# Patient Record
Sex: Female | Born: 1980 | Race: Black or African American | Hispanic: No | Marital: Single | Smoking: Never smoker
Health system: Southern US, Community
[De-identification: ages and names within clinical notes are randomized; demographics above are authoritative.]

## PROBLEM LIST (undated history)

## (undated) DIAGNOSIS — C539 Malignant neoplasm of cervix uteri, unspecified: Secondary | ICD-10-CM

## (undated) HISTORY — PX: CERVIX SURGERY: SHX593

---
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
## 2015-03-08 DIAGNOSIS — C539 Malignant neoplasm of cervix uteri, unspecified: Secondary | ICD-10-CM

## 2015-03-08 HISTORY — DX: Malignant neoplasm of cervix uteri, unspecified: C53.9

## 2018-03-07 NOTE — L&D Delivery Note (Addendum)
OB/GYN Faculty Practice Delivery Note  Maria Reed is a 38 y.o. G3P0020 s/p SVD at [redacted]w[redacted]d. She was admitted for admittedon 10/4/2020with PPROM at 23 weeks.Pt is now [redacted]w[redacted]d transferred to L&D for SOL and Triple I.  ROM: 807h 42m with clear fluid GBS Status: GBS unknown Maximum Maternal Temperature: 99 F  Labor Progress: . Patient presented to the hospital on 12/09/2018 with PPROM at 23 weeks. She was managed on antepartum until 01/11/2019 at which point she started to have painful contractions and to make cervical change. At that time she also developed uterine tenderness and was diagnosed with Triple I and transferred to L&D. Initial SVE on arrival was 2.5 cm. She progressed spontaneously to fully dilated.   Delivery Date/Time: 01/12/2019 at 0045  Delivery: Called to room and patient was complete and pushing. Head delivered ROA. No nuchal cord present. Shoulder and body delivered in usual fashion. Infant initially with spontaneous cry but then became apneic, so cord was clamped and cut immediately. Upon initial clamping cord avulsed at point of clamping and had to be re-clamped, at which point infant handed off to neonatal team. Infant required PPV and subsequently intubation. Cord blood drawn. Placenta delivered spontaneously with gentle cord traction. Fundus firm with massage and Pitocin. Labia, perineum, vagina, and cervix inspected inspected with no lacerations.  Baby Weight: 880 g   Placenta: Sent to pathology  Complications: none Lacerations: none  EBL: 100 ml Analgesia: Epidural   Infant: APGAR (1 MIN): 5   APGAR (5 MINS): 7   APGAR (10 MINS): [place], MD, PGY1  OBGYN Faculty Teaching Service  01/12/2019, 1:06 AM   OB FELLOW ATTESTATION  I was present, gloved, and supervising throughout delivery and have edited the above note to reflect changes and updates.   Augustin Coupe, MD/[facility] [place] Fellow  01/12/2019

## 2018-08-03 ENCOUNTER — Inpatient Hospital Stay (HOSPITAL_COMMUNITY): Payer: Medicaid Other

## 2018-08-03 ENCOUNTER — Encounter (HOSPITAL_COMMUNITY): Payer: Self-pay | Admitting: *Deleted

## 2018-08-03 ENCOUNTER — Inpatient Hospital Stay (HOSPITAL_COMMUNITY)
Admission: AD | Admit: 2018-08-03 | Discharge: 2018-08-03 | Disposition: A | Discharge status: Home / Self Care | Payer: Medicaid Other | Attending: Obstetrics and Gynecology | Admitting: Obstetrics and Gynecology

## 2018-08-03 DIAGNOSIS — Z8541 Personal history of malignant neoplasm of cervix uteri: Secondary | ICD-10-CM | POA: Insufficient documentation

## 2018-08-03 DIAGNOSIS — O9989 Other specified diseases and conditions complicating pregnancy, childbirth and the puerperium: Secondary | ICD-10-CM | POA: Insufficient documentation

## 2018-08-03 DIAGNOSIS — O26891 Other specified pregnancy related conditions, first trimester: Secondary | ICD-10-CM | POA: Diagnosis not present

## 2018-08-03 DIAGNOSIS — O360110 Maternal care for anti-D [Rh] antibodies, first trimester, not applicable or unspecified: Secondary | ICD-10-CM

## 2018-08-03 DIAGNOSIS — O3680X0 Pregnancy with inconclusive fetal viability, not applicable or unspecified: Secondary | ICD-10-CM | POA: Diagnosis not present

## 2018-08-03 DIAGNOSIS — Z3A01 Less than 8 weeks gestation of pregnancy: Secondary | ICD-10-CM

## 2018-08-03 DIAGNOSIS — R109 Unspecified abdominal pain: Secondary | ICD-10-CM | POA: Diagnosis not present

## 2018-08-03 DIAGNOSIS — Z6791 Unspecified blood type, Rh negative: Secondary | ICD-10-CM | POA: Diagnosis not present

## 2018-08-03 DIAGNOSIS — O26899 Other specified pregnancy related conditions, unspecified trimester: Secondary | ICD-10-CM

## 2018-08-03 LAB — ABO/RH: ABO/RH(D): A NEG

## 2018-08-03 LAB — CBC
HCT: 31.4 % — ABNORMAL LOW (ref 36.0–46.0)
Hemoglobin: 11.1 g/dL — ABNORMAL LOW (ref 12.0–15.0)
MCH: 33.6 pg (ref 26.0–34.0)
MCHC: 35.4 g/dL (ref 30.0–36.0)
MCV: 95.2 fL (ref 80.0–100.0)
Platelets: 267 10*3/uL (ref 150–400)
RBC: 3.3 MIL/uL — ABNORMAL LOW (ref 3.87–5.11)
RDW: 11.8 % (ref 11.5–15.5)
WBC: 6.1 10*3/uL (ref 4.0–10.5)
nRBC: 0 % (ref 0.0–0.2)

## 2018-08-03 LAB — WET PREP, GENITAL
Sperm: NONE SEEN
Trich, Wet Prep: NONE SEEN
Yeast Wet Prep HPF POC: NONE SEEN

## 2018-08-03 LAB — URINALYSIS, ROUTINE W REFLEX MICROSCOPIC
Bilirubin Urine: NEGATIVE
Glucose, UA: NEGATIVE mg/dL
Ketones, ur: 5 mg/dL — AB
Nitrite: NEGATIVE
Protein, ur: NEGATIVE mg/dL
Specific Gravity, Urine: 1.003 — ABNORMAL LOW (ref 1.005–1.030)
pH: 7 (ref 5.0–8.0)

## 2018-08-03 LAB — HCG, QUANTITATIVE, PREGNANCY: hCG, Beta Chain, Quant, S: 2215 m[IU]/mL — ABNORMAL HIGH (ref <5)

## 2018-08-03 LAB — POCT PREGNANCY, URINE: Preg Test, Ur: POSITIVE — AB

## 2018-08-03 NOTE — MAU Note (Signed)
Maria Reed is a 38 y.o. at [redacted]w[redacted]d here in MAU reporting:laower abdominal cramping that started last Wednesday, denies pain at present. Denies any vaginal bleeding or abnormal discharge LMP: 07/01/18 Onset of complaint: Wednesday May 27th Pain score: 0 Vitals:   08/03/18 1059  BP: 107/78  Pulse: 90  Resp: 16  Temp: 98.2 F (36.8 C)     F[facility] Lab orders placed from triage: UA/UPT

## 2018-08-03 NOTE — Discharge Instructions (Signed)
[facility]     Phone: [phone]  [facility] [place]  Phone: [phone]  [facility] [place]  Phone: [place] [facility] [employer] [facility] [place]                           Phone: [place] [facility] [place] [facility]          Phone: [phone]  [facility]    Phone: [phone]   [facility])    Phone: [facility]    Phone: [phone]  [facility]    Phone: [place]    Phone: [phone]  [place] [facility]  Phone: [place]               Phone: [phone]  [facility]   Phone: [phone]  [facility]    Phone: [phone]                   Abdominal Pain, Adult  Many things can cause belly (abdominal) pain. Most times, belly pain is not dangerous. Many cases of belly pain can be watched and treated at home. Sometimes belly pain is serious, though. Your doctor will try to find the cause of your belly pain. Follow these instructions at home:  Take over-the-counter and prescription medicines only as told by your doctor. Do not take medicines that help you poop (laxatives) unless told to by your doctor.  Drink enough fluid to keep your pee (urine) clear or pale yellow.  Watch your belly pain for any changes.  Keep all follow-up visits as told by your doctor. This is important. Contact a doctor if:  Your belly pain changes or gets worse.  You are not hungry, or you lose weight without trying.  You are having trouble pooping (constipated) or have watery poop (diarrhea) for more than 2-3 days.  You have pain when you pee or poop.  Your belly pain wakes you up at  night.  Your pain gets worse with meals, after eating, or with certain foods.  You are throwing up and cannot keep anything down.  You have a fever. Get help right away if:  Your pain does not go away as soon as your doctor says it should.  You cannot stop throwing up.  Your pain is only in areas of your belly, such as the right side or the left lower part of the belly.  You have bloody or black poop, or poop that looks like tar.  You have very bad pain, cramping, or bloating in your belly.  You have signs of not having enough fluid or water in your body (dehydration), such as: ? Dark pee, very little pee, or no pee. ? Cracked lips. ? Dry mouth. ? Sunken eyes. ? Sleepiness. ? Weakness. This information is not intended to replace advice given to you by your health care provider. Make sure you discuss any questions you have with your health care provider. Document Released: 08/10/2007 Document Revised: 09/11/2015 Document Reviewed: 08/05/2015 Elsevier Interactive Patient Education  2019 [employer].  Ectopic Pregnancy  An ectopic pregnancy is when the fertilized egg attaches (implants) outside the uterus. Most ectopic pregnancies occur in one of the tubes where eggs travel  from the ovary to the uterus (fallopian tubes), but the implanting can occur in other locations. In rare cases, ectopic pregnancies occur on the ovary, intestine, pelvis, abdomen, or cervix. In an ectopic pregnancy, the fertilized egg does not have the ability to develop into a normal, healthy baby. A ruptured ectopic pregnancy is one in which tearing or bursting of a fallopian tube causes internal bleeding. Often, there is intense lower abdominal pain, and vaginal bleeding sometimes occurs. Having an ectopic pregnancy can be life-threatening. If this dangerous condition is not treated, it can lead to blood loss, shock, or even death. What are the causes? The most common cause of this condition is damage to one  of the fallopian tubes. A fallopian tube may be narrowed or blocked, and that keeps the fertilized egg from reaching the uterus. What increases the risk? This condition is more likely to develop in women of childbearing age who have different levels of risk. The levels of risk can be divided into three categories. High risk  You have gone through infertility treatment.  You have had an ectopic pregnancy before.  You have had surgery on the fallopian tubes, or another surgical procedure, such as an abortion.  You have had surgery to have the fallopian tubes tied (tubal ligation).  You have problems or diseases of the fallopian tubes.  You have been exposed to diethylstilbestrol (DES). This medicine was used until 1971, and it had effects on babies whose mothers took the medicine.  You become pregnant while using an IUD (intrauterine device) for birth control. Moderate risk  You have a history of infertility.  You have had an STI (sexually transmitted infection).  You have a history of pelvic inflammatory disease (PID).  You have scarring from endometriosis.  You have multiple sexual partners.  You smoke. Low risk  You have had pelvic surgery.  You use vaginal douches.  You became sexually active before age 28. What are the signs or symptoms? Common symptoms of this condition include normal pregnancy symptoms, such as missing a period, nausea, tiredness, abdominal pain, breast tenderness, and bleeding. However, ectopic pregnancy will have additional symptoms, such as:  Pain with intercourse.  Irregular vaginal bleeding or spotting.  Cramping or pain on one side or in the lower abdomen.  Fast heartbeat, low blood pressure, and sweating.  Passing out while having a bowel movement. Symptoms of a ruptured ectopic pregnancy and internal bleeding may include:  Sudden, severe pain in the abdomen and pelvis.  Dizziness, weakness, light-headedness, or fainting.  Pain in  the shoulder or neck area. How is this diagnosed? This condition is diagnosed by:  A pelvic exam to locate pain or a mass in the abdomen.  A pregnancy test. This blood test checks for the presence as well as the specific level of pregnancy hormone in the bloodstream.  Ultrasound. This is performed if a pregnancy test is positive. In this test, a probe is inserted into the vagina. The probe will detect a fetus, possibly in a location other than the uterus.  Taking a sample of uterus tissue (dilation and curettage, or D&C).  Surgery to perform a visual exam of the inside of the abdomen using a thin, lighted tube that has a tiny camera on the end (laparoscope).  Culdocentesis. This procedure involves inserting a needle at the top of the vagina, behind the uterus. If blood is present in this area, it may indicate that a fallopian tube is torn. How is this treated? This condition  is treated with medicine or surgery. Medicine  An injection of a medicine (methotrexate) may be given to cause the pregnancy tissue to be absorbed. This medicine may save your fallopian tube. It may be given if: ? The diagnosis is made early, with no signs of active bleeding. ? The fallopian tube has not ruptured. ? You are considered to be a good candidate for the medicine. Usually, pregnancy hormone blood levels are checked after methotrexate treatment. This is to be sure that the medicine is effective. It may take 4-6 weeks for the pregnancy to be absorbed. Most pregnancies will be absorbed by 3 weeks. Surgery  A laparoscope may be used to remove the pregnancy tissue.  If severe internal bleeding occurs, a larger cut (incision) may be made in the lower abdomen (laparotomy) to remove the fetus and placenta. This is done to stop the bleeding.  Part or all of the fallopian tube may be removed (salpingectomy) along with the fetus and placenta. The fallopian tube may also be repaired during the surgery.  In very  rare circumstances, removal of the uterus (hysterectomy) may be required.  After surgery, pregnancy hormone testing may be done to be sure that there is no pregnancy tissue left. Whether your treatment is medicine or surgery, you may receive a Rho (D) immune globulin shot to prevent problems with any future pregnancy. This shot may be given if:  You are Rh-negative and the baby's father is Rh-positive.  You are Rh-negative and you do not know the Rh type of the baby's father. Follow these instructions at home:  Rest and limit your activity after the procedure for as long as told by your health care provider.  Until your health care provider says that it is safe: ? Do not lift anything that is heavier than 10 lb (4.5 kg), or the limit that your health care provider tells you. ? Avoid physical exercise and any movement that requires effort (is strenuous).  To help prevent constipation: ? Eat a healthy diet that includes fruits, vegetables, and whole grains. ? Drink 6-8 glasses of water per day. Get help right away if:  You develop worsening pain that is not relieved by medicine.  You have: ? A fever or chills. ? Vaginal bleeding. ? Redness and swelling at the incision site. ? Nausea and vomiting.  You feel dizzy or weak.  You feel light-headed or you faint. This information is not intended to replace advice given to you by your health care provider. Make sure you discuss any questions you have with your health care provider. Document Released: 03/31/2004 Document Revised: 10/21/2015 Document Reviewed: 09/23/2015 Elsevier Interactive Patient Education  [employer].

## 2018-08-03 NOTE — MAU Provider Note (Signed)
History     CSN: [id number]  Arrival date and time: 08/03/18 1032   First Provider Initiated Contact with Patient 08/03/18 1112      Chief Complaint  Patient presents with  . Abdominal Pain   Maria Reed is a 38 y.o. G3P0020 at [redacted]w[redacted]d who presents to MAU for "pregnancy". When pt was asked by nurse and provider her chief complaint, pt reports "pregnancy." Pt reports she was [facility] to [facility] by [facility] to receive an [place]. Pt denied pain or bleeding to RN and initially denied pain, bleeding or other symptoms to the provider, but after being told she would not receive an [place] or a work-up if she was not experiencing any concerns today, pt grasps her stomach with both of her hands and states "the pain just came back" and rates it as 0/93 around the umbilicus.  Pt denies VB, vaginal discharge/odor/itching. Pt denies N/V, constipation, diarrhea, or urinary problems. Pt denies fever, chills, fatigue, sweating or changes in appetite. Pt denies SOB or chest pain. Pt denies dizziness, HA, light-headedness, weakness.   OB History    Gravida  3   Para      Term      Preterm      AB  2   Living        SAB  2   TAB      Ectopic      Multiple      Live Births              Past Medical History:  Diagnosis Date  . Cervical cancer [facility])     Past Surgical History:  Procedure Laterality Date  . CERVIX SURGERY      No family history on file.  Social History   Tobacco Use  . Smoking status: Never Smoker  . Smokeless tobacco: Never Used  Substance Use Topics  . Alcohol use: Never    Frequency: Never  . Drug use: Never    Allergies: No Known Allergies  No medications prior to admission.    Review of Systems  Constitutional: Negative for chills, diaphoresis, fatigue and fever.  Respiratory: Negative for shortness of breath.   Cardiovascular: Negative for chest pain.  Gastrointestinal: Positive for abdominal pain. Negative for constipation,  diarrhea, nausea and vomiting.  Genitourinary: Negative for dysuria, flank pain, frequency, pelvic pain, urgency, vaginal bleeding and vaginal discharge.  Neurological: Negative for dizziness, weakness, light-headedness and headaches.   Physical Exam   Blood pressure 107/78, pulse 90, temperature 98.2 F (36.8 C), resp. rate 16, height 5\' 2"  (1.575 m), weight 46.3 kg, last menstrual period 07/01/2018.  Patient Vitals for the past 24 hrs:  BP Temp Pulse Resp Height Weight  08/03/18 1059 107/78 98.2 F (36.8 C) 90 16 5\' 2"  (1.575 m) 46.3 kg   Physical Exam  Constitutional: She is oriented to person, place, and time. She appears well-developed and well-nourished. No distress.  HENT:  Head: Normocephalic and atraumatic.  Respiratory: Effort normal.  GI: Soft. She exhibits no distension and no mass. There is no abdominal tenderness. There is no rebound and no guarding.  Genitourinary: There is no rash, tenderness or lesion on the right labia. There is no rash, tenderness or lesion on the left labia. Uterus is not fixed and not tender. Cervix exhibits no motion tenderness, no discharge and no friability. Right adnexum displays no mass, no tenderness and no fullness. Left adnexum displays no mass, no tenderness and no fullness.  Vaginal discharge (scant, thin, white, odorless) present.     No vaginal tenderness or bleeding.  No tenderness or bleeding in the vagina.  Neurological: She is alert and oriented to person, place, and time.  Skin: Skin is warm and dry. She is not diaphoretic.  Psychiatric: She has a normal mood and affect. Her behavior is normal. Judgment and thought content normal.   Results for orders placed or performed during the hospital encounter of 08/03/18 (from the past 24 hour(s))  Pregnancy, urine POC     Status: Abnormal   Collection Time: 08/03/18 10:55 AM  Result Value Ref Range   Preg Test, Ur POSITIVE (A) NEGATIVE  Urinalysis, Routine w reflex microscopic      Status: Abnormal   Collection Time: 08/03/18 11:02 AM  Result Value Ref Range   Color, Urine YELLOW YELLOW   APPearance HAZY (A) CLEAR   Specific Gravity, Urine 1.003 (L) 1.005 - 1.030   pH 7.0 5.0 - 8.0   Glucose, UA NEGATIVE NEGATIVE mg/dL   Hgb urine dipstick MODERATE (A) NEGATIVE   Bilirubin Urine NEGATIVE NEGATIVE   Ketones, ur 5 (A) NEGATIVE mg/dL   Protein, ur NEGATIVE NEGATIVE mg/dL   Nitrite NEGATIVE NEGATIVE   Leukocytes,Ua SMALL (A) NEGATIVE   RBC / HPF 6-10 0 - 5 RBC/hpf   WBC, UA 0-5 0 - 5 WBC/hpf   Bacteria, UA MANY (A) NONE SEEN   Squamous Epithelial / LPF 11-20 0 - 5  Wet prep, genital     Status: Abnormal   Collection Time: 08/03/18 11:25 AM  Result Value Ref Range   Yeast Wet Prep HPF POC NONE SEEN NONE SEEN   Trich, Wet Prep NONE SEEN NONE SEEN   Clue Cells Wet Prep HPF POC PRESENT (A) NONE SEEN   WBC, Wet Prep HPF POC FEW (A) NONE SEEN   Sperm NONE SEEN   CBC     Status: Abnormal   Collection Time: 08/03/18 11:39 AM  Result Value Ref Range   WBC 6.1 4.0 - 10.5 K/uL   RBC 3.30 (L) 3.87 - 5.11 MIL/uL   Hemoglobin 11.1 (L) 12.0 - 15.0 g/dL   HCT 31.4 (L) 36.0 - 46.0 %   MCV 95.2 80.0 - 100.0 fL   MCH 33.6 26.0 - 34.0 pg   MCHC 35.4 30.0 - 36.0 g/dL   RDW 11.8 11.5 - 15.5 %   Platelets 267 150 - 400 K/uL   nRBC 0.0 0.0 - 0.2 %  hCG, quantitative, pregnancy     Status: Abnormal   Collection Time: 08/03/18 11:39 AM  Result Value Ref Range   hCG, Beta Chain, Quant, S 2,215 (H) <5 mIU/mL  ABO/Rh     Status: None   Collection Time: 08/03/18 11:39 AM  Result Value Ref Range   ABO/RH(D)      A NEG Performed at [facility], [address] [place], [place] [address]    US Ob Less Than 14 Weeks With Ob Transvaginal  Result Date: 08/03/2018 CLINICAL DATA:  Abdominal/pelvic cramping EXAM: OBSTETRIC <14 WK [place] AND TRANSVAGINAL OB US TECHNIQUE: Both transabdominal and transvaginal ultrasound examinations were performed for complete evaluation of the  gestation as well as the maternal uterus, adnexal regions, and pelvic cul-de-sac. Transvaginal technique was performed to assess early pregnancy. COMPARISON:  None. FINDINGS: Intrauterine gestational sac: Not visualized Yolk sac:  Not visualized Embryo:  Not visualized Cardiac Activity: Not visualized Subchorionic hemorrhage:  None visualized. Maternal uterus/adnexae: Endometrium measures 13 mm  in thickness. Right ovary measures 3.1 x 1.4 x 3.0 cm. Left ovary measures 2.0 x 1.2 x 1.2 cm. There is no extrauterine pelvic or adnexal mass. There is a small amount of free pelvic fluid. IMPRESSION: No intrauterine mass or intrauterine gestation. Assuming positive beta HCG value, differential considerations would include intrauterine gestation too early to be seen by either transabdominal or transvaginal technique; recent spontaneous abortion; ectopic gestation. This circumstance warrants close clinical and laboratory evaluation. Timing of repeat ultrasound in large part will depend on beta HCG values going forward. No extrauterine pelvic mass. Small amount of free pelvic fluid noted. This small amount of fluid could be due to recent ovarian cyst rupture. Electronically Signed   By: Lowella Grip III M.D.   On: 08/03/2018 12:12    MAU Course  Procedures  MDM -r/o ectopic -UA: hazy/mod hgb/5ketones/sm leuks/many bacteria, sending urine for culture -CBC: WNL for pregnancy -[place]: no IUP, small amt of free pelvic fluid -hCG: 2,215 -ABO: A NEGATIVE -WetPrep: +ClueCells (isolated finding, not requiring treatment), few WBCs, otherwise WNL -GC/CT collected -pt to return to MAU in 48hrs for repeat hCG -pt discharged to home in stable condition  Orders Placed This Encounter  Procedures  . Wet prep, genital    Standing Status:   Standing    Number of Occurrences:   1  . Culture, OB Urine    Standing Status:   Standing    Number of Occurrences:   1  . US OB LESS THAN 14 WEEKS WITH OB TRANSVAGINAL     Standing Status:   Standing    Number of Occurrences:   1    Order Specific Question:   Symptom/Reason for Exam    Answer:   Abdominal pain in pregnancy [[id number]]  . Urinalysis, Routine w reflex microscopic    Standing Status:   Standing    Number of Occurrences:   1  . CBC    Standing Status:   Standing    Number of Occurrences:   1  . hCG, quantitative, pregnancy    Standing Status:   Standing    Number of Occurrences:   1  . Pregnancy, urine POC    Standing Status:   Standing    Number of Occurrences:   1  . ABO/Rh    Standing Status:   Standing    Number of Occurrences:   1  . Discharge patient    Order Specific Question:   Discharge disposition    Answer:   01-Home or Self Care [1]    Order Specific Question:   Discharge patient date    Answer:   08/03/2018   No orders of the defined types were placed in this encounter.  Assessment and Plan   1. Pregnancy of unknown anatomic location   2. Abdominal pain in pregnancy   3. Rh negative state in antepartum period    Allergies as of 08/03/2018   No Known Allergies     Medication List    You have not been prescribed any medications.    -will call with culture results, if positive -start PNVs -pt to return to MAU on Sunday AM for repeat hCG -strict ectopic/return MAU precautions given -pt discharged to home in stable condition  Elmyra Ricks E  08/03/2018, 1:31 PM

## 2018-08-05 ENCOUNTER — Other Ambulatory Visit: Payer: Self-pay

## 2018-08-05 ENCOUNTER — Inpatient Hospital Stay (HOSPITAL_COMMUNITY)
Admission: AD | Admit: 2018-08-05 | Discharge: 2018-08-05 | Disposition: A | Discharge status: Home / Self Care | Payer: Medicaid Other | Attending: Family Medicine | Admitting: Family Medicine

## 2018-08-05 DIAGNOSIS — O3680X0 Pregnancy with inconclusive fetal viability, not applicable or unspecified: Secondary | ICD-10-CM | POA: Diagnosis not present

## 2018-08-05 LAB — HCG, QUANTITATIVE, PREGNANCY: hCG, Beta Chain, Quant, S: 4440 m[IU]/mL — ABNORMAL HIGH (ref <5)

## 2018-08-05 NOTE — MAU Note (Signed)
Pt reports to MAU for FU labs.  Pt denies any pain or bleeding at this time.

## 2018-08-05 NOTE — MAU Provider Note (Signed)
  S Ms. Kayonna Lawniczak is a 38 y.o. G13P0020 pregnant female who presents to MAU for repeat Quant hCG. She denies bleeding or cramping.   O BP 91/66 (BP Location: Right Arm)   Pulse (!) 101   Temp 97.6 F (36.4 C) (Oral)   Resp 18   Ht 5\' 2"  (1.575 m)   Wt 47.1 kg   LMP 07/01/2018   SpO2 100%   BMI 19.00 kg/m  Physical Exam  Nursing note and vitals reviewed. Constitutional: She is oriented to person, place, and time. She appears well-developed.  Cardiovascular: Normal rate.  Respiratory: Effort normal.  GI: Soft.  Musculoskeletal: Normal range of motion.  Neurological: She is alert and oriented to person, place, and time.  Skin: Skin is warm and dry.  Psychiatric: She has a normal mood and affect. Her behavior is normal. Judgment and thought content normal.   A Appropriate rise in quant hCG 2215-->4440 Medical screening exam complete  P Follow-up viability/dating scan ordered Discharge from MAU in stable condition Warning signs for worsening condition that would warrant emergency follow-up discussed Patient may return to MAU as needed for pregnancy related complaints  Darlina Rumpf, [place] 08/05/2018 1:41 PM

## 2018-08-06 LAB — CULTURE, OB URINE: Culture: >=100000 — AB

## 2018-08-06 LAB — GC/CHLAMYDIA PROBE AMP (~~LOC~~) NOT AT ARMC
Chlamydia: NEGATIVE
Neisseria Gonorrhea: NEGATIVE

## 2018-08-07 ENCOUNTER — Other Ambulatory Visit: Payer: Self-pay | Admitting: Obstetrics and Gynecology

## 2018-08-07 DIAGNOSIS — O2341 Unspecified infection of urinary tract in pregnancy, first trimester: Secondary | ICD-10-CM

## 2018-08-07 DIAGNOSIS — T3695XA Adverse effect of unspecified systemic antibiotic, initial encounter: Secondary | ICD-10-CM

## 2018-08-07 DIAGNOSIS — B379 Candidiasis, unspecified: Secondary | ICD-10-CM

## 2018-08-07 MED ORDER — CEFADROXIL 500 MG PO CAPS: 500.0000 mg | ORAL_CAPSULE | Freq: Two times a day (BID) | ORAL | 0 refills | Status: DC

## 2018-08-07 MED ORDER — TERCONAZOLE 0.4 % VA CREA: 1.0000 | TOPICAL_CREAM | Freq: Every day | VAGINAL | 0 refills | Status: AC

## 2018-08-07 NOTE — Progress Notes (Signed)
TC to notify pt of abx Rx. Pt request Rx for medication for yeast infection. She states she is prone to getting yeast infections after taking abx.  Laury Deep, CNM

## 2018-09-17 ENCOUNTER — Encounter: Payer: Self-pay | Admitting: Obstetrics and Gynecology

## 2018-09-17 ENCOUNTER — Ambulatory Visit (INDEPENDENT_AMBULATORY_CARE_PROVIDER_SITE_OTHER): Payer: Medicaid Other | Admitting: Obstetrics and Gynecology

## 2018-09-17 ENCOUNTER — Other Ambulatory Visit (HOSPITAL_COMMUNITY)
Admission: RE | Admit: 2018-09-17 | Discharge: 2018-09-17 | Disposition: A | Discharge status: Home / Self Care | Payer: Medicaid Other | Source: Ambulatory Visit | Attending: Obstetrics and Gynecology | Admitting: Obstetrics and Gynecology

## 2018-09-17 ENCOUNTER — Encounter: Payer: Self-pay | Admitting: Obstetrics

## 2018-09-17 ENCOUNTER — Other Ambulatory Visit: Payer: Self-pay

## 2018-09-17 DIAGNOSIS — O09511 Supervision of elderly primigravida, first trimester: Secondary | ICD-10-CM | POA: Diagnosis not present

## 2018-09-17 DIAGNOSIS — O26891 Other specified pregnancy related conditions, first trimester: Secondary | ICD-10-CM | POA: Diagnosis not present

## 2018-09-17 DIAGNOSIS — O09519 Supervision of elderly primigravida, unspecified trimester: Secondary | ICD-10-CM | POA: Insufficient documentation

## 2018-09-17 DIAGNOSIS — Z3400 Encounter for supervision of normal first pregnancy, unspecified trimester: Secondary | ICD-10-CM | POA: Insufficient documentation

## 2018-09-17 DIAGNOSIS — Z3A11 11 weeks gestation of pregnancy: Secondary | ICD-10-CM

## 2018-09-17 DIAGNOSIS — Z6791 Unspecified blood type, Rh negative: Secondary | ICD-10-CM | POA: Insufficient documentation

## 2018-09-17 DIAGNOSIS — O360910 Maternal care for other rhesus isoimmunization, first trimester, not applicable or unspecified: Secondary | ICD-10-CM

## 2018-09-17 DIAGNOSIS — O0990 Supervision of high risk pregnancy, unspecified, unspecified trimester: Secondary | ICD-10-CM | POA: Insufficient documentation

## 2018-09-17 DIAGNOSIS — O26899 Other specified pregnancy related conditions, unspecified trimester: Secondary | ICD-10-CM | POA: Insufficient documentation

## 2018-09-17 MED ORDER — BLOOD PRESSURE MONITORING KIT: 1.0000 | PACK | 0 refills | Status: DC

## 2018-09-17 MED ORDER — ASPIRIN EC 81 MG PO TBEC: 81.0000 mg | DELAYED_RELEASE_TABLET | Freq: Every day | ORAL | 2 refills | Status: DC

## 2018-09-17 NOTE — Addendum Note (Signed)
Addended by: Tristan Schroeder D on: 09/17/2018 04:35 PM   Modules accepted: Orders

## 2018-09-17 NOTE — Progress Notes (Signed)
NOB in office, denies pain.

## 2018-09-17 NOTE — Patient Instructions (Signed)
First Trimester of Pregnancy The first trimester of pregnancy is from week 1 until the end of week 13 (months 1 through 3). A week after a sperm fertilizes an egg, the egg will implant on the wall of the uterus. This embryo will begin to develop into a baby. Genes from you and your partner will form the baby. The female genes will determine whether the baby will be a boy or a girl. At 6-8 weeks, the eyes and face will be formed, and the heartbeat can be seen on ultrasound. At the end of 12 weeks, all the baby's organs will be formed. Now that you are pregnant, you will want to do everything you can to have a healthy baby. Two of the most important things are to get good prenatal care and to follow your health care provider's instructions. Prenatal care is all the medical care you receive before the baby's birth. This care will help prevent, find, and treat any problems during the pregnancy and childbirth. Body changes during your first trimester Your body goes through many changes during pregnancy. The changes vary from woman to woman.  You may gain or lose a couple of pounds at first.  You may feel sick to your stomach (nauseous) and you may throw up (vomit). If the vomiting is uncontrollable, call your health care provider.  You may tire easily.  You may develop headaches that can be relieved by medicines. All medicines should be approved by your health care provider.  You may urinate more often. Painful urination may mean you have a bladder infection.  You may develop heartburn as a result of your pregnancy.  You may develop constipation because certain hormones are causing the muscles that push stool through your intestines to slow down.  You may develop hemorrhoids or swollen veins (varicose veins).  Your breasts may begin to grow larger and become tender. Your nipples may stick out more, and the tissue that surrounds them (areola) may become darker.  Your gums may bleed and may be  sensitive to brushing and flossing.  Dark spots or blotches (chloasma, mask of pregnancy) may develop on your face. This will likely fade after the baby is born.  Your menstrual periods will stop.  You may have a loss of appetite.  You may develop cravings for certain kinds of food.  You may have changes in your emotions from day to day, such as being excited to be pregnant or being concerned that something may go wrong with the pregnancy and baby.  You may have more vivid and strange dreams.  You may have changes in your hair. These can include thickening of your hair, rapid growth, and changes in texture. Some women also have hair loss during or after pregnancy, or hair that feels dry or thin. Your hair will most likely return to normal after your baby is born. What to expect at prenatal visits During a routine prenatal visit:  You will be weighed to make sure you and the baby are growing normally.  Your blood pressure will be taken.  Your abdomen will be measured to track your baby's growth.  The fetal heartbeat will be listened to between weeks 10 and 14 of your pregnancy.  Test results from any previous visits will be discussed. Your health care provider may ask you:  How you are feeling.  If you are feeling the baby move.  If you have had any abnormal symptoms, such as leaking fluid, bleeding, severe headaches, or  abdominal cramping.  If you are using any tobacco products, including cigarettes, chewing tobacco, and electronic cigarettes.  If you have any questions. Other tests that may be performed during your first trimester include:  Blood tests to find your blood type and to check for the presence of any previous infections. The tests will also be used to check for low iron levels (anemia) and protein on red blood cells (Rh antibodies). Depending on your risk factors, or if you previously had diabetes during pregnancy, you may have tests to check for high blood sugar  that affects pregnant women (gestational diabetes).  Urine tests to check for infections, diabetes, or protein in the urine.  An ultrasound to confirm the proper growth and development of the baby.  Fetal screens for spinal cord problems (spina bifida) and Down syndrome.  HIV (human immunodeficiency virus) testing. Routine prenatal testing includes screening for HIV, unless you choose not to have this test.  You may need other tests to make sure you and the baby are doing well. Follow these instructions at home: Medicines  Follow your health care provider's instructions regarding medicine use. Specific medicines may be either safe or unsafe to take during pregnancy.  Take a prenatal vitamin that contains at least 600 micrograms (mcg) of folic acid.  If you develop constipation, try taking a stool softener if your health care provider approves. Eating and drinking   Eat a balanced diet that includes fresh fruits and vegetables, whole grains, good sources of protein such as meat, eggs, or tofu, and low-fat dairy. Your health care provider will help you determine the amount of weight gain that is right for you.  Avoid raw meat and uncooked cheese. These carry germs that can cause birth defects in the baby.  Eating four or five small meals rather than three large meals a day may help relieve nausea and vomiting. If you start to feel nauseous, eating a few soda crackers can be helpful. Drinking liquids between meals, instead of during meals, also seems to help ease nausea and vomiting.  Limit foods that are high in fat and processed sugars, such as fried and sweet foods.  To prevent constipation: ? Eat foods that are high in fiber, such as fresh fruits and vegetables, whole grains, and beans. ? Drink enough fluid to keep your urine clear or pale yellow. Activity  Exercise only as directed by your health care provider. Most women can continue their usual exercise routine during  pregnancy. Try to exercise for 30 minutes at least 5 days a week. Exercising will help you: ? Control your weight. ? Stay in shape. ? Be prepared for labor and delivery.  Experiencing pain or cramping in the lower abdomen or lower back is a good sign that you should stop exercising. Check with your health care provider before continuing with normal exercises.  Try to avoid standing for long periods of time. Move your legs often if you must stand in one place for a long time.  Avoid heavy lifting.  Wear low-heeled shoes and practice good posture.  You may continue to have sex unless your health care provider tells you not to. Relieving pain and discomfort  Wear a good support bra to relieve breast tenderness.  Take warm sitz baths to soothe any pain or discomfort caused by hemorrhoids. Use hemorrhoid cream if your health care provider approves.  Rest with your legs elevated if you have leg cramps or low back pain.  If you develop varicose veins  in your legs, wear support hose. Elevate your feet for 15 minutes, 3-4 times a day. Limit salt in your diet. Prenatal care  Schedule your prenatal visits by the twelfth week of pregnancy. They are usually scheduled monthly at first, then more often in the last 2 months before delivery.  Write down your questions. Take them to your prenatal visits.  Keep all your prenatal visits as told by your health care provider. This is important. Safety  Wear your seat belt at all times when driving.  Make a list of emergency phone numbers, including numbers for family, friends, the hospital, and police and fire departments. General instructions  Ask your health care provider for a referral to a local prenatal education class. Begin classes no later than the beginning of month 6 of your pregnancy.  Ask for help if you have counseling or nutritional needs during pregnancy. Your health care provider can offer advice or refer you to specialists for help  with various needs.  Do not use hot tubs, steam rooms, or saunas.  Do not douche or use tampons or scented sanitary pads.  Do not cross your legs for long periods of time.  Avoid cat litter boxes and soil used by cats. These carry germs that can cause birth defects in the baby and possibly loss of the fetus by miscarriage or stillbirth.  Avoid all smoking, herbs, alcohol, and medicines not prescribed by your health care provider. Chemicals in these products affect the formation and growth of the baby.  Do not use any products that contain nicotine or tobacco, such as cigarettes and e-cigarettes. If you need help quitting, ask your health care provider. You may receive counseling support and other resources to help you quit.  Schedule a dentist appointment. At home, brush your teeth with a soft toothbrush and be gentle when you floss. Contact a health care provider if:  You have dizziness.  You have mild pelvic cramps, pelvic pressure, or nagging pain in the abdominal area.  You have persistent nausea, vomiting, or diarrhea.  You have a bad smelling vaginal discharge.  You have pain when you urinate.  You notice increased swelling in your face, hands, legs, or ankles.  You are exposed to fifth disease or chickenpox.  You are exposed to [place] measles (rubella) and have never had it. Get help right away if:  You have a fever.  You are leaking fluid from your vagina.  You have spotting or bleeding from your vagina.  You have severe abdominal cramping or pain.  You have rapid weight gain or loss.  You vomit blood or material that looks like coffee grounds.  You develop a severe headache.  You have shortness of breath.  You have any kind of trauma, such as from a fall or a car accident. Summary  The first trimester of pregnancy is from week 1 until the end of week 13 (months 1 through 3).  Your body goes through many changes during pregnancy. The changes vary from  woman to woman.  You will have routine prenatal visits. During those visits, your health care provider will examine you, discuss any test results you may have, and talk with you about how you are feeling. This information is not intended to replace advice given to you by your health care provider. Make sure you discuss any questions you have with your health care provider. Document Released: 02/15/2001 Document Revised: 02/03/2017 Document Reviewed: 02/03/2016 Elsevier Patient Education  2020 [employer].  Second Trimester of Pregnancy The second trimester is from week 14 through week 27 (months 4 through 6). The second trimester is often a time when you feel your best. Your body has adjusted to being pregnant, and you begin to feel better physically. Usually, morning sickness has lessened or quit completely, you may have more energy, and you may have an increase in appetite. The second trimester is also a time when the fetus is growing rapidly. At the end of the sixth month, the fetus is about 9 inches long and weighs about 1 pounds. You will likely begin to feel the baby move (quickening) between 16 and 20 weeks of pregnancy. Body changes during your second trimester Your body continues to go through many changes during your second trimester. The changes vary from woman to woman.  Your weight will continue to increase. You will notice your lower abdomen bulging out.  You may begin to get stretch marks on your hips, abdomen, and breasts.  You may develop headaches that can be relieved by medicines. The medicines should be approved by your health care provider.  You may urinate more often because the fetus is pressing on your bladder.  You may develop or continue to have heartburn as a result of your pregnancy.  You may develop constipation because certain hormones are causing the muscles that push waste through your intestines to slow down.  You may develop hemorrhoids or swollen,  bulging veins (varicose veins).  You may have back pain. This is caused by: ? Weight gain. ? Pregnancy hormones that are relaxing the joints in your pelvis. ? A shift in weight and the muscles that support your balance.  Your breasts will continue to grow and they will continue to become tender.  Your gums may bleed and may be sensitive to brushing and flossing.  Dark spots or blotches (chloasma, mask of pregnancy) may develop on your face. This will likely fade after the baby is born.  A dark line from your belly button to the pubic area (linea nigra) may appear. This will likely fade after the baby is born.  You may have changes in your hair. These can include thickening of your hair, rapid growth, and changes in texture. Some women also have hair loss during or after pregnancy, or hair that feels dry or thin. Your hair will most likely return to normal after your baby is born. What to expect at prenatal visits During a routine prenatal visit:  You will be weighed to make sure you and the fetus are growing normally.  Your blood pressure will be taken.  Your abdomen will be measured to track your baby's growth.  The fetal heartbeat will be listened to.  Any test results from the previous visit will be discussed. Your health care provider may ask you:  How you are feeling.  If you are feeling the baby move.  If you have had any abnormal symptoms, such as leaking fluid, bleeding, severe headaches, or abdominal cramping.  If you are using any tobacco products, including cigarettes, chewing tobacco, and electronic cigarettes.  If you have any questions. Other tests that may be performed during your second trimester include:  Blood tests that check for: ? Low iron levels (anemia). ? High blood sugar that affects pregnant women (gestational diabetes) between 48 and 28 weeks. ? Rh antibodies. This is to check for a protein on red blood cells (Rh factor).  Urine tests to check  for infections, diabetes, or protein in  the urine.  An ultrasound to confirm the proper growth and development of the baby.  An amniocentesis to check for possible genetic problems.  Fetal screens for spina bifida and Down syndrome.  HIV (human immunodeficiency virus) testing. Routine prenatal testing includes screening for HIV, unless you choose not to have this test. Follow these instructions at home: Medicines  Follow your health care provider's instructions regarding medicine use. Specific medicines may be either safe or unsafe to take during pregnancy.  Take a prenatal vitamin that contains at least 600 micrograms (mcg) of folic acid.  If you develop constipation, try taking a stool softener if your health care provider approves. Eating and drinking   Eat a balanced diet that includes fresh fruits and vegetables, whole grains, good sources of protein such as meat, eggs, or tofu, and low-fat dairy. Your health care provider will help you determine the amount of weight gain that is right for you.  Avoid raw meat and uncooked cheese. These carry germs that can cause birth defects in the baby.  If you have low calcium intake from food, talk to your health care provider about whether you should take a daily calcium supplement.  Limit foods that are high in fat and processed sugars, such as fried and sweet foods.  To prevent constipation: ? Drink enough fluid to keep your urine clear or pale yellow. ? Eat foods that are high in fiber, such as fresh fruits and vegetables, whole grains, and beans. Activity  Exercise only as directed by your health care provider. Most women can continue their usual exercise routine during pregnancy. Try to exercise for 30 minutes at least 5 days a week. Stop exercising if you experience uterine contractions.  Avoid heavy lifting, wear low heel shoes, and practice good posture.  A sexual relationship may be continued unless your health care provider  directs you otherwise. Relieving pain and discomfort  Wear a good support bra to prevent discomfort from breast tenderness.  Take warm sitz baths to soothe any pain or discomfort caused by hemorrhoids. Use hemorrhoid cream if your health care provider approves.  Rest with your legs elevated if you have leg cramps or low back pain.  If you develop varicose veins, wear support hose. Elevate your feet for 15 minutes, 3-4 times a day. Limit salt in your diet. Prenatal Care  Write down your questions. Take them to your prenatal visits.  Keep all your prenatal visits as told by your health care provider. This is important. Safety  Wear your seat belt at all times when driving.  Make a list of emergency phone numbers, including numbers for family, friends, the hospital, and police and fire departments. General instructions  Ask your health care provider for a referral to a local prenatal education class. Begin classes no later than the beginning of month 6 of your pregnancy.  Ask for help if you have counseling or nutritional needs during pregnancy. Your health care provider can offer advice or refer you to specialists for help with various needs.  Do not use hot tubs, steam rooms, or saunas.  Do not douche or use tampons or scented sanitary pads.  Do not cross your legs for long periods of time.  Avoid cat litter boxes and soil used by cats. These carry germs that can cause birth defects in the baby and possibly loss of the fetus by miscarriage or stillbirth.  Avoid all smoking, herbs, alcohol, and unprescribed drugs. Chemicals in these products can affect the formation  and growth of the baby.  Do not use any products that contain nicotine or tobacco, such as cigarettes and e-cigarettes. If you need help quitting, ask your health care provider.  Visit your dentist if you have not gone yet during your pregnancy. Use a soft toothbrush to brush your teeth and be gentle when you  floss. Contact a health care provider if:  You have dizziness.  You have mild pelvic cramps, pelvic pressure, or nagging pain in the abdominal area.  You have persistent nausea, vomiting, or diarrhea.  You have a bad smelling vaginal discharge.  You have pain when you urinate. Get help right away if:  You have a fever.  You are leaking fluid from your vagina.  You have spotting or bleeding from your vagina.  You have severe abdominal cramping or pain.  You have rapid weight gain or weight loss.  You have shortness of breath with chest pain.  You notice sudden or extreme swelling of your face, hands, ankles, feet, or legs.  You have not felt your baby move in over an hour.  You have severe headaches that do not go away when you take medicine.  You have vision changes. Summary  The second trimester is from week 14 through week 27 (months 4 through 6). It is also a time when the fetus is growing rapidly.  Your body goes through many changes during pregnancy. The changes vary from woman to woman.  Avoid all smoking, herbs, alcohol, and unprescribed drugs. These chemicals affect the formation and growth your baby.  Do not use any tobacco products, such as cigarettes, chewing tobacco, and e-cigarettes. If you need help quitting, ask your health care provider.  Contact your health care provider if you have any questions. Keep all prenatal visits as told by your health care provider. This is important. This information is not intended to replace advice given to you by your health care provider. Make sure you discuss any questions you have with your health care provider. Document Released: 02/15/2001 Document Revised: 06/15/2018 Document Reviewed: 03/29/2016 Elsevier Patient Education  2020 [employer].   Contraception Choices Contraception, also called birth control, refers to methods or devices that prevent pregnancy. Hormonal methods Contraceptive implant  A  contraceptive implant is a thin, plastic tube that contains a hormone. It is inserted into the upper part of the arm. It can remain in place for up to 3 years. Progestin-only injections Progestin-only injections are injections of progestin, a synthetic form of the hormone progesterone. They are given every 3 months by a health care provider. Birth control pills  Birth control pills are pills that contain hormones that prevent pregnancy. They must be taken once a day, preferably at the same time each day. Birth control patch  The birth control patch contains hormones that prevent pregnancy. It is placed on the skin and must be changed once a week for three weeks and removed on the fourth week. A prescription is needed to use this method of contraception. Vaginal ring  A vaginal ring contains hormones that prevent pregnancy. It is placed in the vagina for three weeks and removed on the fourth week. After that, the process is repeated with a new ring. A prescription is needed to use this method of contraception. Emergency contraceptive Emergency contraceptives prevent pregnancy after unprotected sex. They come in pill form and can be taken up to 5 days after sex. They work best the sooner they are taken after having sex. Most emergency contraceptives  are available without a prescription. This method should not be used as your only form of birth control. Barrier methods Female condom  A female condom is a thin sheath that is worn over the penis during sex. Condoms keep sperm from going inside a woman's body. They can be used with a spermicide to increase their effectiveness. They should be disposed after a single use. Female condom  A female condom is a soft, loose-fitting sheath that is put into the vagina before sex. The condom keeps sperm from going inside a woman's body. They should be disposed after a single use. Diaphragm  A diaphragm is a soft, dome-shaped barrier. It is inserted into the  vagina before sex, along with a spermicide. The diaphragm blocks sperm from entering the uterus, and the spermicide kills sperm. A diaphragm should be left in the vagina for 6-8 hours after sex and removed within 24 hours. A diaphragm is prescribed and fitted by a health care provider. A diaphragm should be replaced every 1-2 years, after giving birth, after gaining more than 15 lb (6.8 kg), and after pelvic surgery. Cervical cap  A cervical cap is a round, soft latex or plastic cup that fits over the cervix. It is inserted into the vagina before sex, along with spermicide. It blocks sperm from entering the uterus. The cap should be left in place for 6-8 hours after sex and removed within 48 hours. A cervical cap must be prescribed and fitted by a health care provider. It should be replaced every 2 years. Sponge  A sponge is a soft, circular piece of polyurethane foam with spermicide on it. The sponge helps block sperm from entering the uterus, and the spermicide kills sperm. To use it, you make it wet and then insert it into the vagina. It should be inserted before sex, left in for at least 6 hours after sex, and removed and thrown away within 30 hours. Spermicides Spermicides are chemicals that kill or block sperm from entering the cervix and uterus. They can come as a cream, jelly, suppository, foam, or tablet. A spermicide should be inserted into the vagina with an applicator at least 09-98 minutes before sex to allow time for it to work. The process must be repeated every time you have sex. Spermicides do not require a prescription. Intrauterine contraception Intrauterine device (IUD) An IUD is a T-shaped device that is put in a woman's uterus. There are two types:  Hormone IUD.This type contains progestin, a synthetic form of the hormone progesterone. This type can stay in place for 3-5 years.  Copper IUD.This type is wrapped in copper wire. It can stay in place for 10 years.  Permanent  methods of contraception Female tubal ligation In this method, a woman's fallopian tubes are sealed, tied, or blocked during surgery to prevent eggs from traveling to the uterus. Hysteroscopic sterilization In this method, a small, flexible insert is placed into each fallopian tube. The inserts cause scar tissue to form in the fallopian tubes and block them, so sperm cannot reach an egg. The procedure takes about 3 months to be effective. Another form of birth control must be used during those 3 months. Female sterilization This is a procedure to tie off the tubes that carry sperm (vasectomy). After the procedure, the man can still ejaculate fluid (semen). Natural planning methods Natural family planning In this method, a couple does not have sex on days when the woman could become pregnant. Calendar method This means keeping track of  the length of each menstrual cycle, identifying the days when pregnancy can happen, and not having sex on those days. Ovulation method In this method, a couple avoids sex during ovulation. Symptothermal method This method involves not having sex during ovulation. The woman typically checks for ovulation by watching changes in her temperature and in the consistency of cervical mucus. Post-ovulation method In this method, a couple waits to have sex until after ovulation. Summary  Contraception, also called birth control, means methods or devices that prevent pregnancy.  Hormonal methods of contraception include implants, injections, pills, patches, vaginal rings, and emergency contraceptives.  Barrier methods of contraception can include female condoms, female condoms, diaphragms, cervical caps, sponges, and spermicides.  There are two types of IUDs (intrauterine devices). An IUD can be put in a woman's uterus to prevent pregnancy for 3-5 years.  Permanent sterilization can be done through a procedure for males, females, or both.  Natural family planning methods  involve not having sex on days when the woman could become pregnant. This information is not intended to replace advice given to you by your health care provider. Make sure you discuss any questions you have with your health care provider. Document Released: 02/21/2005 Document Revised: 02/23/2017 Document Reviewed: 03/26/2016 Elsevier Patient Education  2020 [employer].   Breastfeeding  Choosing to breastfeed is one of the best decisions you can make for yourself and your baby. A change in hormones during pregnancy causes your breasts to make breast milk in your milk-producing glands. Hormones prevent breast milk from being released before your baby is born. They also prompt milk flow after birth. Once breastfeeding has begun, thoughts of your baby, as well as his or her sucking or crying, can stimulate the release of milk from your milk-producing glands. Benefits of breastfeeding Research shows that breastfeeding offers many health benefits for infants and mothers. It also offers a cost-free and convenient way to feed your baby. For your baby  Your first milk (colostrum) helps your baby's digestive system to function better.  Special cells in your milk (antibodies) help your baby to fight off infections.  Breastfed babies are less likely to develop asthma, allergies, obesity, or type 2 diabetes. They are also at lower risk for sudden infant death syndrome (SIDS).  Nutrients in breast milk are better able to meet your babys needs compared to infant formula.  Breast milk improves your baby's brain development. For you  Breastfeeding helps to create a very special bond between you and your baby.  Breastfeeding is convenient. Breast milk costs nothing and is always available at the correct temperature.  Breastfeeding helps to burn calories. It helps you to lose the weight that you gained during pregnancy.  Breastfeeding makes your uterus return faster to its size before pregnancy. It  also slows bleeding (lochia) after you give birth.  Breastfeeding helps to lower your risk of developing type 2 diabetes, osteoporosis, rheumatoid arthritis, cardiovascular disease, and breast, ovarian, uterine, and endometrial cancer later in life. Breastfeeding basics Starting breastfeeding  Find a comfortable place to sit or lie down, with your neck and back well-supported.  Place a pillow or a rolled-up blanket under your baby to bring him or her to the level of your breast (if you are seated). Nursing pillows are specially designed to help support your arms and your baby while you breastfeed.  Make sure that your baby's tummy (abdomen) is facing your abdomen.  Gently massage your breast. With your fingertips, massage from the outer edges  of your breast inward toward the nipple. This encourages milk flow. If your milk flows slowly, you may need to continue this action during the feeding.  Support your breast with 4 fingers underneath and your thumb above your nipple (make the letter "C" with your hand). Make sure your fingers are well away from your nipple and your babys mouth.  Stroke your baby's lips gently with your finger or nipple.  When your baby's mouth is open wide enough, quickly bring your baby to your breast, placing your entire nipple and as much of the areola as possible into your baby's mouth. The areola is the colored area around your nipple. ? More areola should be visible above your baby's upper lip than below the lower lip. ? Your baby's lips should be opened and extended outward (flanged) to ensure an adequate, comfortable latch. ? Your baby's tongue should be between his or her lower gum and your breast.  Make sure that your baby's mouth is correctly positioned around your nipple (latched). Your baby's lips should create a seal on your breast and be turned out (everted).  It is common for your baby to suck about 2-3 minutes in order to start the flow of breast  milk. Latching Teaching your baby how to latch onto your breast properly is very important. An improper latch can cause nipple pain, decreased milk supply, and poor weight gain in your baby. Also, if your baby is not latched onto your nipple properly, he or she may swallow some air during feeding. This can make your baby fussy. Burping your baby when you switch breasts during the feeding can help to get rid of the air. However, teaching your baby to latch on properly is still the best way to prevent fussiness from swallowing air while breastfeeding. Signs that your baby has successfully latched onto your nipple  Silent tugging or silent sucking, without causing you pain. Infant's lips should be extended outward (flanged).  Swallowing heard between every 3-4 sucks once your milk has started to flow (after your let-down milk reflex occurs).  Muscle movement above and in front of his or her ears while sucking. Signs that your baby has not successfully latched onto your nipple  Sucking sounds or smacking sounds from your baby while breastfeeding.  Nipple pain. If you think your baby has not latched on correctly, slip your finger into the corner of your babys mouth to break the suction and place it between your baby's gums. Attempt to start breastfeeding again. Signs of successful breastfeeding Signs from your baby  Your baby will gradually decrease the number of sucks or will completely stop sucking.  Your baby will fall asleep.  Your baby's body will relax.  Your baby will retain a small amount of milk in his or her mouth.  Your baby will let go of your breast by himself or herself. Signs from you  Breasts that have increased in firmness, weight, and size 1-3 hours after feeding.  Breasts that are softer immediately after breastfeeding.  Increased milk volume, as well as a change in milk consistency and color by the fifth day of breastfeeding.  Nipples that are not sore, cracked, or  bleeding. Signs that your baby is getting enough milk  Wetting at least 1-2 diapers during the first 24 hours after birth.  Wetting at least 5-6 diapers every 24 hours for the first week after birth. The urine should be clear or pale yellow by the age of 5 days.  Wetting  6-8 diapers every 24 hours as your baby continues to grow and develop.  At least 3 stools in a 24-hour period by the age of 5 days. The stool should be soft and yellow.  At least 3 stools in a 24-hour period by the age of 7 days. The stool should be seedy and yellow.  No loss of weight greater than 10% of birth weight during the first 3 days of life.  Average weight gain of 4-7 oz (113-198 g) per week after the age of 4 days.  Consistent daily weight gain by the age of 5 days, without weight loss after the age of 2 weeks. After a feeding, your baby may spit up a small amount of milk. This is normal. Breastfeeding frequency and duration Frequent feeding will help you make more milk and can prevent sore nipples and extremely full breasts (breast engorgement). Breastfeed when you feel the need to reduce the fullness of your breasts or when your baby shows signs of hunger. This is called "breastfeeding on demand." Signs that your baby is hungry include:  Increased alertness, activity, or restlessness.  Movement of the head from side to side.  Opening of the mouth when the corner of the mouth or cheek is stroked (rooting).  Increased sucking sounds, smacking lips, cooing, sighing, or squeaking.  Hand-to-mouth movements and sucking on fingers or hands.  Fussing or crying. Avoid introducing a pacifier to your baby in the first 4-6 weeks after your baby is born. After this time, you may choose to use a pacifier. Research has shown that pacifier use during the first year of a baby's life decreases the risk of sudden infant death syndrome (SIDS). Allow your baby to feed on each breast as long as he or she wants. When your  baby unlatches or falls asleep while feeding from the first breast, offer the second breast. Because newborns are often sleepy in the first few weeks of life, you may need to awaken your baby to get him or her to feed. Breastfeeding times will vary from baby to baby. However, the following rules can serve as a guide to help you make sure that your baby is properly fed:  Newborns (babies 60 weeks of age or younger) may breastfeed every 1-3 hours.  Newborns should not go without breastfeeding for longer than 3 hours during the day or 5 hours during the night.  You should breastfeed your baby a minimum of 8 times in a 24-hour period. Breast milk pumping     Pumping and storing breast milk allows you to make sure that your baby is exclusively fed your breast milk, even at times when you are unable to breastfeed. This is especially important if you go back to work while you are still breastfeeding, or if you are not able to be present during feedings. Your lactation consultant can help you find a method of pumping that works best for you and give you guidelines about how long it is safe to store breast milk. Caring for your breasts while you breastfeed Nipples can become dry, cracked, and sore while breastfeeding. The following recommendations can help keep your breasts moisturized and healthy:  Avoid using soap on your nipples.  Wear a supportive bra designed especially for nursing. Avoid wearing underwire-style bras or extremely tight bras (sports bras).  Air-dry your nipples for 3-4 minutes after each feeding.  Use only cotton bra pads to absorb leaked breast milk. Leaking of breast milk between feedings is normal.  Use lanolin on your nipples after breastfeeding. Lanolin helps to maintain your skin's normal moisture barrier. Pure lanolin is not harmful (not toxic) to your baby. You may also hand express a few drops of breast milk and gently massage that milk into your nipples and allow the milk  to air-dry. In the first few weeks after giving birth, some women experience breast engorgement. Engorgement can make your breasts feel heavy, warm, and tender to the touch. Engorgement peaks within 3-5 days after you give birth. The following recommendations can help to ease engorgement:  Completely empty your breasts while breastfeeding or pumping. You may want to start by applying warm, moist heat (in the shower or with warm, water-soaked hand towels) just before feeding or pumping. This increases circulation and helps the milk flow. If your baby does not completely empty your breasts while breastfeeding, pump any extra milk after he or she is finished.  Apply ice packs to your breasts immediately after breastfeeding or pumping, unless this is too uncomfortable for you. To do this: ? Put ice in a plastic bag. ? Place a towel between your skin and the bag. ? Leave the ice on for 20 minutes, 2-3 times a day.  Make sure that your baby is latched on and positioned properly while breastfeeding. If engorgement persists after 48 hours of following these recommendations, contact your health care provider or a Science writer. Overall health care recommendations while breastfeeding  Eat 3 healthy meals and 3 snacks every day. Well-nourished mothers who are breastfeeding need an additional 450-500 calories a day. You can meet this requirement by increasing the amount of a balanced diet that you eat.  Drink enough water to keep your urine pale yellow or clear.  Rest often, relax, and continue to take your prenatal vitamins to prevent fatigue, stress, and low vitamin and mineral levels in your body (nutrient deficiencies).  Do not use any products that contain nicotine or tobacco, such as cigarettes and e-cigarettes. Your baby may be harmed by chemicals from cigarettes that pass into breast milk and exposure to secondhand smoke. If you need help quitting, ask your health care provider.  Avoid  alcohol.  Do not use illegal drugs or marijuana.  Talk with your health care provider before taking any medicines. These include over-the-counter and prescription medicines as well as vitamins and herbal supplements. Some medicines that may be harmful to your baby can pass through breast milk.  It is possible to become pregnant while breastfeeding. If birth control is desired, ask your health care provider about options that will be safe while breastfeeding your baby. Where to find more information: [employer]: www.llli.org Contact a health care provider if:  You feel like you want to stop breastfeeding or have become frustrated with breastfeeding.  Your nipples are cracked or bleeding.  Your breasts are red, tender, or warm.  You have: ? Painful breasts or nipples. ? A swollen area on either breast. ? A fever or chills. ? Nausea or vomiting. ? Drainage other than breast milk from your nipples.  Your breasts do not become full before feedings by the fifth day after you give birth.  You feel sad and depressed.  Your baby is: ? Too sleepy to eat well. ? Having trouble sleeping. ? More than 77 week old and wetting fewer than 6 diapers in a 24-hour period. ? Not gaining weight by 58 days of age.  Your baby has fewer than 3 stools in a 24-hour period.  Your baby's skin or the white parts of his or her eyes become yellow. Get help right away if:  Your baby is overly tired (lethargic) and does not want to wake up and feed.  Your baby develops an unexplained fever. Summary  Breastfeeding offers many health benefits for infant and mothers.  Try to breastfeed your infant when he or she shows early signs of hunger.  Gently tickle or stroke your baby's lips with your finger or nipple to allow the baby to open his or her mouth. Bring the baby to your breast. Make sure that much of the areola is in your baby's mouth. Offer one side and burp the baby before you offer  the other side.  Talk with your health care provider or lactation consultant if you have questions or you face problems as you breastfeed. This information is not intended to replace advice given to you by your health care provider. Make sure you discuss any questions you have with your health care provider. Document Released: 02/21/2005 Document Revised: 05/18/2017 Document Reviewed: 03/25/2016 Elsevier Patient Education  2020 [employer].

## 2018-09-17 NOTE — Progress Notes (Signed)
   Subjective:    Maria Reed is a G75P0020 [redacted]w[redacted]d being seen today for her first obstetrical visit.  Her obstetrical history is significant for advanced maternal age. Patient does intend to breast feed. Pregnancy history fully reviewed.  Patient reports fatigue and nausea.  Vitals:   09/17/18 1018  BP: 95/62  Pulse: 91  Weight: 101 lb (45.8 kg)    HISTORY: OB History  Gravida Para Term Preterm AB Living  3       2    SAB TAB Ectopic Multiple Live Births  2            # Outcome Date GA Lbr Len/2nd Weight Sex Delivery Anes PTL Lv  3 Current           2 SAB 02/2018 [redacted]w[redacted]d         1 SAB 07/2017 [redacted]w[redacted]d          Past Medical History:  Diagnosis Date  . Cervical cancer ([place]) 2017   Past Surgical History:  Procedure Laterality Date  . CERVIX SURGERY     History reviewed. No pertinent family history.   Exam    Uterus:   12-weeks  Pelvic Exam:    Perineum: No Hemorrhoids, Normal Perineum   Vulva: normal   Vagina:  normal mucosa, normal discharge   pH:    Cervix: nulliparous appearance and cervix is closed and long   Adnexa: normal adnexa and no mass, fullness, tenderness   Bony Pelvis: gynecoid  System: Breast:  normal appearance, no masses or tenderness   Skin: normal coloration and turgor, no rashes    Neurologic: oriented, no focal deficits   Extremities: normal strength, tone, and muscle mass   HEENT extra ocular movement intact   Mouth/Teeth mucous membranes moist, pharynx normal without lesions and dental hygiene good   Neck supple and no masses   Cardiovascular: regular rate and rhythm   Respiratory:  appears well, vitals normal, no respiratory distress, acyanotic, normal RR, ear and throat exam is normal, chest clear, no wheezing, crepitations, rhonchi, normal symmetric air entry   Abdomen: soft, non-tender; bowel sounds normal; no masses,  no organomegaly   Urinary:       Assessment:    Pregnancy: [id number] Patient Active Problem List   Diagnosis Date  Noted  . Supervision of normal pregnancy, antepartum 09/17/2018  . Rh negative status during pregnancy 09/17/2018  . AMA (advanced maternal age) primigravida 35+ 09/17/2018        Plan:     Initial labs drawn. Prenatal vitamins. Problem list reviewed and updated. Genetic Screening discussed : Panorama requested.  Ultrasound discussed; fetal survey: ordered. Rx ASA provided given diagnosis of AMA  Follow up in 4 weeks via virtual visit. 50% of 30 min visit spent on counseling and coordination of care.     Maria Reed 09/17/2018

## 2018-09-18 LAB — CERVICOVAGINAL ANCILLARY ONLY
Bacterial vaginitis: NEGATIVE
Candida vaginitis: NEGATIVE
Chlamydia: NEGATIVE
Neisseria Gonorrhea: NEGATIVE
Trichomonas: NEGATIVE

## 2018-09-19 LAB — OBSTETRIC PANEL, INCLUDING HIV
Antibody Screen: NEGATIVE
Basophils Absolute: 0 10*3/uL (ref 0.0–0.2)
Basos: 0 %
EOS (ABSOLUTE): 0 10*3/uL (ref 0.0–0.4)
Eos: 0 %
HIV Screen 4th Generation wRfx: NONREACTIVE
Hematocrit: 31.7 % — ABNORMAL LOW (ref 34.0–46.6)
Hemoglobin: 10.8 g/dL — ABNORMAL LOW (ref 11.1–15.9)
Hepatitis B Surface Ag: NEGATIVE
Immature Grans (Abs): 0 10*3/uL (ref 0.0–0.1)
Immature Granulocytes: 0 %
Lymphocytes Absolute: 1.4 10*3/uL (ref 0.7–3.1)
Lymphs: 18 %
MCH: 33.2 pg — ABNORMAL HIGH (ref 26.6–33.0)
MCHC: 34.1 g/dL (ref 31.5–35.7)
MCV: 98 fL — ABNORMAL HIGH (ref 79–97)
Monocytes Absolute: 0.3 10*3/uL (ref 0.1–0.9)
Monocytes: 3 %
Neutrophils Absolute: 6.2 10*3/uL (ref 1.4–7.0)
Neutrophils: 79 %
Platelets: 285 10*3/uL (ref 150–450)
RBC: 3.25 x10E6/uL — ABNORMAL LOW (ref 3.77–5.28)
RDW: 12 % (ref 11.7–15.4)
RPR Ser Ql: NONREACTIVE
Rh Factor: NEGATIVE
Rubella Antibodies, IGG: 3.07 index (ref >0.99)
WBC: 7.9 10*3/uL (ref 3.4–10.8)

## 2018-09-19 LAB — CYTOLOGY - PAP
Diagnosis: NEGATIVE
HPV: NOT DETECTED

## 2018-09-19 MED ORDER — FERROUS SULFATE 325 (65 FE) MG PO TABS: 325.0000 mg | ORAL_TABLET | Freq: Two times a day (BID) | ORAL | 1 refills | Status: DC

## 2018-09-19 NOTE — Addendum Note (Signed)
Addended by: Mora Bellman on: 09/19/2018 09:51 AM   Modules accepted: Orders

## 2018-09-20 LAB — URINE CULTURE, OB REFLEX

## 2018-09-20 LAB — CULTURE, OB URINE

## 2018-09-24 ENCOUNTER — Encounter: Payer: Self-pay | Admitting: Obstetrics and Gynecology

## 2018-10-15 ENCOUNTER — Encounter: Payer: Self-pay | Admitting: Obstetrics and Gynecology

## 2018-10-15 ENCOUNTER — Ambulatory Visit (INDEPENDENT_AMBULATORY_CARE_PROVIDER_SITE_OTHER): Payer: Medicaid Other | Admitting: Obstetrics and Gynecology

## 2018-10-15 DIAGNOSIS — Z3400 Encounter for supervision of normal first pregnancy, unspecified trimester: Secondary | ICD-10-CM

## 2018-10-15 DIAGNOSIS — O26891 Other specified pregnancy related conditions, first trimester: Secondary | ICD-10-CM | POA: Diagnosis not present

## 2018-10-15 DIAGNOSIS — O09511 Supervision of elderly primigravida, first trimester: Secondary | ICD-10-CM

## 2018-10-15 DIAGNOSIS — Z3A15 15 weeks gestation of pregnancy: Secondary | ICD-10-CM

## 2018-10-15 DIAGNOSIS — Z6791 Unspecified blood type, Rh negative: Secondary | ICD-10-CM

## 2018-10-15 NOTE — Progress Notes (Signed)
   TELEHEALTH OBSTETRICS PRENATAL VIRTUAL VIDEO VISIT ENCOUNTER NOTE  Provider location: [facility] for [facility] [place]   I connected with [name] on 10/15/18 at 10:30 AM EDT by WebEx Video Encounter at home and verified that I am speaking with the correct person using two identifiers.   I discussed the limitations, risks, security and privacy concerns of performing an evaluation and management service virtually and the availability of in person appointments. I also discussed with the patient that there may be a patient responsible charge related to this service. The patient expressed understanding and agreed to proceed. Subjective:  Maria Reed is a 38 y.o. G3P0020 at [redacted]w[redacted]d being seen today for ongoing prenatal care.  She is currently monitored for the following issues for this low-risk pregnancy and has Supervision of normal pregnancy, antepartum; Rh negative status during pregnancy; and AMA (advanced maternal age) primigravida 37+ on their problem list.  Patient reports no complaints.  Contractions: Not present. Vag. Bleeding: None.  Movement: Present. Denies any leaking of fluid.   The following portions of the patient's history were reviewed and updated as appropriate: allergies, current medications, past family history, past medical history, past social history, past surgical history and problem list.   Objective:  There were no vitals filed for this visit.  Fetal Status:     Movement: Present     General:  Alert, oriented and cooperative. Patient is in no acute distress.  Respiratory: Normal respiratory effort, no problems with respiration noted  Mental Status: Normal mood and affect. Normal behavior. Normal judgment and thought content.  Rest of physical exam deferred due to type of encounter  Imaging: No results found.  Assessment and Plan:  Pregnancy: G3P0020 at [redacted]w[redacted]d  1. Supervision of normal first pregnancy, antepartum Reviewed panorama, OB panel  2.  Rh negative status during pregnancy in first trimester - Reviewed rho gam, importance of presenting for bleeding, patient verbalizes understanding - Rho gam 28 weeks  3. Primigravida of advanced maternal age in first trimester  Preterm labor symptoms and general obstetric precautions including but not limited to vaginal bleeding, contractions, leaking of fluid and fetal movement were reviewed in detail with the patient. I discussed the assessment and treatment plan with the patient. The patient was provided an opportunity to ask questions and all were answered. The patient agreed with the plan and demonstrated an understanding of the instructions. The patient was advised to call back or seek an in-person office evaluation/go to [facility] at [facility] for any urgent or concerning symptoms. Please refer to After Visit Summary for other counseling recommendations.   I provided 15 minutes of face-to-face time during this encounter.  Return in about 4 weeks (around 11/12/2018) for OB visit, virtual.  Future Appointments  Date Time Provider [place]  11/13/2018  9:30 AM [place] [facility]  11/13/2018  9:30 AM [facility] [place] 5 [facility]    Griffin Gerrard M [place], [place]

## 2018-10-15 NOTE — Progress Notes (Signed)
Pt does not have BP cuff at home, will check on order today. Pt has no other concerns.

## 2018-11-05 ENCOUNTER — Telehealth: Payer: Self-pay

## 2018-11-05 NOTE — Telephone Encounter (Signed)
Returned call and patient stated that she was having some mild cramping every now and again. Pt denies bleeding, reports feeling fetal flutter movements, advised to increase water intake, advised of warning signs, and to report to the hospital if symptoms change, pt agreed.

## 2018-11-13 ENCOUNTER — Ambulatory Visit (HOSPITAL_COMMUNITY): Payer: Medicaid Other | Admitting: *Deleted

## 2018-11-13 ENCOUNTER — Ambulatory Visit (INDEPENDENT_AMBULATORY_CARE_PROVIDER_SITE_OTHER): Payer: Medicaid Other | Admitting: Obstetrics

## 2018-11-13 ENCOUNTER — Ambulatory Visit (HOSPITAL_COMMUNITY)
Admission: RE | Admit: 2018-11-13 | Discharge: 2018-11-13 | Disposition: A | Discharge status: Home / Self Care | Payer: Medicaid Other | Source: Ambulatory Visit | Attending: Obstetrics and Gynecology | Admitting: Obstetrics and Gynecology

## 2018-11-13 ENCOUNTER — Other Ambulatory Visit: Payer: Medicaid Other

## 2018-11-13 ENCOUNTER — Encounter: Payer: Self-pay | Admitting: Obstetrics

## 2018-11-13 ENCOUNTER — Other Ambulatory Visit: Payer: Self-pay

## 2018-11-13 ENCOUNTER — Encounter (HOSPITAL_COMMUNITY): Payer: Self-pay

## 2018-11-13 DIAGNOSIS — Z3400 Encounter for supervision of normal first pregnancy, unspecified trimester: Secondary | ICD-10-CM | POA: Insufficient documentation

## 2018-11-13 DIAGNOSIS — O09522 Supervision of elderly multigravida, second trimester: Secondary | ICD-10-CM

## 2018-11-13 DIAGNOSIS — O0992 Supervision of high risk pregnancy, unspecified, second trimester: Secondary | ICD-10-CM | POA: Diagnosis not present

## 2018-11-13 DIAGNOSIS — O0990 Supervision of high risk pregnancy, unspecified, unspecified trimester: Secondary | ICD-10-CM

## 2018-11-13 DIAGNOSIS — Z3A19 19 weeks gestation of pregnancy: Secondary | ICD-10-CM

## 2018-11-13 DIAGNOSIS — Z6791 Unspecified blood type, Rh negative: Secondary | ICD-10-CM

## 2018-11-13 DIAGNOSIS — O360120 Maternal care for anti-D [Rh] antibodies, second trimester, not applicable or unspecified: Secondary | ICD-10-CM

## 2018-11-13 DIAGNOSIS — O09512 Supervision of elderly primigravida, second trimester: Secondary | ICD-10-CM | POA: Insufficient documentation

## 2018-11-13 DIAGNOSIS — O09529 Supervision of elderly multigravida, unspecified trimester: Secondary | ICD-10-CM

## 2018-11-13 MED ORDER — BLOOD PRESSURE MONITOR KIT: 1.0000 | PACK | 0 refills | Status: DC

## 2018-11-13 NOTE — Progress Notes (Signed)
TELEHEALTH OBSTETRICS PRENATAL VIRTUAL VIDEO VISIT ENCOUNTER NOTE  Provider location: [facility] [place]   I connected with [name] on 11/13/18 at  2:00 PM EDT by WebEx OB Video Encounter at home and verified that I am speaking with the correct person using two identifiers.   I discussed the limitations, risks, security and privacy concerns of performing an evaluation and management service virtually and the availability of in person appointments. I also discussed with the patient that there may be a patient responsible charge related to this service. The patient expressed understanding and agreed to proceed. Subjective:  Maria Reed is a 38 y.o. G3P0020 at 51w2dbeing seen today for ongoing prenatal care.  She is currently monitored for the following issues for this high-risk pregnancy and has Supervision of normal pregnancy, antepartum; Rh negative status during pregnancy; and AMA (advanced maternal age) primigravida 35+ on their problem list.  Patient reports no complaints.  Contractions: Not present. Vag. Bleeding: None.  Movement: Present. Denies any leaking of fluid.   The following portions of the patient's history were reviewed and updated as appropriate: allergies, current medications, past family history, past medical history, past social history, past surgical history and problem list.   Objective:  There were no vitals filed for this visit.  Fetal Status:     Movement: Present     General:  Alert, oriented and cooperative. Patient is in no acute distress.  Respiratory: Normal respiratory effort, no problems with respiration noted  Mental Status: Normal mood and affect. Normal behavior. Normal judgment and thought content.  Rest of physical exam deferred due to type of encounter  Imaging: [place]fm Ob Detail +14 Wk  Result Date: 11/13/2018 ----------------------------------------------------------------------  OBSTETRICS REPORT                        (Signed Final 11/13/2018 10:44 am) ---------------------------------------------------------------------- Patient Info  ID #:       [id number]                         D.O.B.:  [id number](38 yrs)  Name:       Maria Reed                  Visit Date: 11/13/2018 09:10 am ---------------------------------------------------------------------- Performed By  Performed By:     AHubert Azure         Ref. Address:     Faculty                    RDMS  Attending:        RTama HighMD        [facility]  Referred By:      PMora BellmanMD ---------------------------------------------------------------------- Orders   #  Description  Code         Ordered By   1  [place] MFM OB DETAIL +14 WK              [id number]     PEGGY CONSTANT  ----------------------------------------------------------------------   #  Order #                    Accession #                 Episode #   1  [id number]                  [id number]                  [id number]  ---------------------------------------------------------------------- Indications   [redacted] weeks gestation of pregnancy                Z3A.19   Encounter for antenatal screening for          Z36.3   malformations (Low risk NIPs)   Rh negative state in antepartum                O57.0190   Advanced maternal age multigravida 78,         O2.522   second trimester  ---------------------------------------------------------------------- Vital Signs  Weight (lb): 101                               Height:        5'2"  BMI:         18.47 ---------------------------------------------------------------------- Fetal Evaluation  Num Of Fetuses:         1  Fetal Heart Rate(bpm):  143  Cardiac Activity:       Observed  Presentation:           Cephalic  Placenta:               Anterior  P. Cord Insertion:      Visualized, central  Amniotic Fluid  AFI FV:      Within normal  limits                              Largest Pocket(cm)                              4.98 ---------------------------------------------------------------------- Biometry  BPD:      47.1  mm     G. Age:  20w 2d         86  %    CI:        75.65   %    70 - 86                                                          FL/HC:      17.8   %    16.1 - 18.3  HC:      171.7  mm     G. Age:  19w 5d         65  %    HC/AC:      1.18  1.09 - 1.39  AC:       145   mm     G. Age:  19w 6d         63  %    FL/BPD:     65.0   %  FL:       30.6  mm     G. Age:  19w 3d         50  %    FL/AC:      21.1   %    20 - 24  HUM:      29.2  mm     G. Age:  19w 4d         57  %  CER:      20.6  mm     G. Age:  19w 4d         56  %  NFT:       5.5  mm  LV:        6.1  mm  CM:        2.7  mm  Est. FW:     307  gm    0 lb 11 oz      69  % ---------------------------------------------------------------------- OB History  Gravidity:    3          SAB:   2 ---------------------------------------------------------------------- Gestational Age  LMP:           19w 2d        Date:  07/01/18                 EDD:   04/07/19  U/S Today:     19w 6d                                        EDD:   04/03/19  Best:          19w 2d     Det. By:  LMP  (07/01/18)          EDD:   04/07/19 ---------------------------------------------------------------------- Anatomy  Cranium:               Appears normal         Aortic Arch:            Appears normal  Cavum:                 Appears normal         Ductal Arch:            Appears normal  Ventricles:            Appears normal         Diaphragm:              Appears normal  Choroid Plexus:        Appears normal         Stomach:                Appears normal, left  sided  Cerebellum:            Appears normal         Abdomen:                Appears normal  Posterior Fossa:       Appears normal         Abdominal Wall:         Appears nml (cord                                                                         insert, abd wall)  Nuchal Fold:           Appears normal         Cord Vessels:           Appears normal (3                                                                        vessel cord)  Face:                  Appears normal         Kidneys:                Appear normal                         (orbits and profile)  Lips:                  Appears normal         Bladder:                Appears normal  Thoracic:              Appears normal         Spine:                  Appears normal  Heart:                 Appears normal         Upper Extremities:      Appears normal                         (4CH, axis, and                         situs)  RVOT:                  Appears normal         Lower Extremities:      Appears normal  LVOT:                  Appears normal  Other:  Fetus appears to be a female. Heels and 5th digit visualized. Nasal          bone visualized.  Open hands visualized. Technically difficult due to          fetal position. ---------------------------------------------------------------------- Cervix Uterus Adnexa  Cervix  Length:           3.69  cm.  Normal appearance by transabdominal scan.  Uterus  No abnormality visualized.  Left Ovary  Within normal limits.  Right Ovary  Within normal limits.  Adnexa  No abnormality visualized. ---------------------------------------------------------------------- Impression  We performed fetal anatomy scan. No makers of  aneuploidies or fetal structural defects are seen. Fetal  biometry is consistent with her previously-established dates.  Amniotic fluid is normal and good fetal activity is seen.  Patient understands the limitations of ultrasound in detecting  fetal anomalies.  On cell-free fetal DNA screening, the risks of fetal  aneuploidies are not increased.  Patient will get her blood drawn today for MSAFP screening. ----------------------------------------------------------------------  Recommendations  Follow-up scans as clinically indicated. ----------------------------------------------------------------------                  Tama High, MD Electronically Signed Final Report   11/13/2018 10:44 am ----------------------------------------------------------------------   Assessment and Plan:  Pregnancy: [id number] at 82w2d1. Supervision of high risk pregnancy, antepartum Rx: - Blood Pressure Monitor KIT; 1 Device by Does not apply route once a week. To be monitored Regularly at home.  Dispense: 1 kit; Refill: 0  2. Antepartum multigravida of advanced maternal age  38 Rh negative status during pregnancy in first trimester   Preterm labor symptoms and general obstetric precautions including but not limited to vaginal bleeding, contractions, leaking of fluid and fetal movement were reviewed in detail with the patient. I discussed the assessment and treatment plan with the patient. The patient was provided an opportunity to ask questions and all were answered. The patient agreed with the plan and demonstrated an understanding of the instructions. The patient was advised to call back or seek an in-person office evaluation/[facility]. Please refer to After Visit Summary for other counseling recommendations.   I provided 10 minutes of face-to-face time during this encounter.  Return in about 4 weeks (around 12/11/2018) for WebEx.    CBaltazar Najjar MD [facility] 11/13/2018

## 2018-11-13 NOTE — Progress Notes (Signed)
Virtual ROB   CC: None  

## 2018-11-15 LAB — AFP, SERUM, OPEN SPINA BIFIDA
AFP MoM: 0.83
AFP Value: 59.5 ng/mL
Gest. Age on Collection Date: 19.2 weeks
Maternal Age At EDD: 37.7 yr
OSBR Risk 1 IN: 10000
Test Results:: NEGATIVE
Weight: 103 [lb_av]

## 2018-12-09 ENCOUNTER — Other Ambulatory Visit: Payer: Self-pay

## 2018-12-09 ENCOUNTER — Inpatient Hospital Stay (HOSPITAL_COMMUNITY): Payer: Medicaid Other

## 2018-12-09 ENCOUNTER — Encounter (HOSPITAL_COMMUNITY): Payer: Self-pay | Admitting: *Deleted

## 2018-12-09 ENCOUNTER — Inpatient Hospital Stay (HOSPITAL_COMMUNITY)
Admission: AD | Admit: 2018-12-09 | Discharge: 2019-01-14 | DRG: 805 | Disposition: A | Discharge status: Home / Self Care | Payer: Medicaid Other | Attending: Obstetrics and Gynecology | Admitting: Obstetrics and Gynecology

## 2018-12-09 DIAGNOSIS — Z20828 Contact with and (suspected) exposure to other viral communicable diseases: Secondary | ICD-10-CM | POA: Diagnosis present

## 2018-12-09 DIAGNOSIS — O9902 Anemia complicating childbirth: Secondary | ICD-10-CM | POA: Diagnosis present

## 2018-12-09 DIAGNOSIS — O26893 Other specified pregnancy related conditions, third trimester: Secondary | ICD-10-CM | POA: Diagnosis present

## 2018-12-09 DIAGNOSIS — O42912 Preterm premature rupture of membranes, unspecified as to length of time between rupture and onset of labor, second trimester: Secondary | ICD-10-CM | POA: Diagnosis present

## 2018-12-09 DIAGNOSIS — D649 Anemia, unspecified: Secondary | ICD-10-CM | POA: Diagnosis present

## 2018-12-09 DIAGNOSIS — O09519 Supervision of elderly primigravida, unspecified trimester: Secondary | ICD-10-CM | POA: Diagnosis not present

## 2018-12-09 DIAGNOSIS — O42112 Preterm premature rupture of membranes, onset of labor more than 24 hours following rupture, second trimester: Secondary | ICD-10-CM | POA: Diagnosis present

## 2018-12-09 DIAGNOSIS — O09522 Supervision of elderly multigravida, second trimester: Secondary | ICD-10-CM

## 2018-12-09 DIAGNOSIS — O09513 Supervision of elderly primigravida, third trimester: Secondary | ICD-10-CM | POA: Diagnosis not present

## 2018-12-09 DIAGNOSIS — Z3A23 23 weeks gestation of pregnancy: Secondary | ICD-10-CM

## 2018-12-09 DIAGNOSIS — O360931 Maternal care for other rhesus isoimmunization, third trimester, fetus 1: Secondary | ICD-10-CM | POA: Diagnosis not present

## 2018-12-09 DIAGNOSIS — O26899 Other specified pregnancy related conditions, unspecified trimester: Secondary | ICD-10-CM

## 2018-12-09 DIAGNOSIS — O42913 Preterm premature rupture of membranes, unspecified as to length of time between rupture and onset of labor, third trimester: Secondary | ICD-10-CM | POA: Diagnosis not present

## 2018-12-09 DIAGNOSIS — Z3A26 26 weeks gestation of pregnancy: Secondary | ICD-10-CM | POA: Diagnosis not present

## 2018-12-09 DIAGNOSIS — Z362 Encounter for other antenatal screening follow-up: Secondary | ICD-10-CM

## 2018-12-09 DIAGNOSIS — O411230 Chorioamnionitis, third trimester, not applicable or unspecified: Secondary | ICD-10-CM | POA: Diagnosis present

## 2018-12-09 DIAGNOSIS — O411290 Chorioamnionitis, unspecified trimester, not applicable or unspecified: Secondary | ICD-10-CM | POA: Diagnosis not present

## 2018-12-09 DIAGNOSIS — O4103X0 Oligohydramnios, third trimester, not applicable or unspecified: Secondary | ICD-10-CM | POA: Diagnosis not present

## 2018-12-09 DIAGNOSIS — Z3490 Encounter for supervision of normal pregnancy, unspecified, unspecified trimester: Secondary | ICD-10-CM

## 2018-12-09 DIAGNOSIS — O09512 Supervision of elderly primigravida, second trimester: Secondary | ICD-10-CM | POA: Diagnosis not present

## 2018-12-09 DIAGNOSIS — Z6791 Unspecified blood type, Rh negative: Secondary | ICD-10-CM

## 2018-12-09 DIAGNOSIS — O411220 Chorioamnionitis, second trimester, not applicable or unspecified: Secondary | ICD-10-CM | POA: Diagnosis not present

## 2018-12-09 DIAGNOSIS — O26892 Other specified pregnancy related conditions, second trimester: Secondary | ICD-10-CM

## 2018-12-09 DIAGNOSIS — O0990 Supervision of high risk pregnancy, unspecified, unspecified trimester: Secondary | ICD-10-CM

## 2018-12-09 DIAGNOSIS — O99019 Anemia complicating pregnancy, unspecified trimester: Secondary | ICD-10-CM | POA: Diagnosis present

## 2018-12-09 DIAGNOSIS — Z3A24 24 weeks gestation of pregnancy: Secondary | ICD-10-CM | POA: Diagnosis not present

## 2018-12-09 DIAGNOSIS — Z3A25 25 weeks gestation of pregnancy: Secondary | ICD-10-CM | POA: Diagnosis not present

## 2018-12-09 DIAGNOSIS — O42919 Preterm premature rupture of membranes, unspecified as to length of time between rupture and onset of labor, unspecified trimester: Secondary | ICD-10-CM

## 2018-12-09 DIAGNOSIS — Z3A27 27 weeks gestation of pregnancy: Secondary | ICD-10-CM | POA: Diagnosis not present

## 2018-12-09 DIAGNOSIS — O360920 Maternal care for other rhesus isoimmunization, second trimester, not applicable or unspecified: Secondary | ICD-10-CM | POA: Diagnosis not present

## 2018-12-09 LAB — WET PREP, GENITAL
Clue Cells Wet Prep HPF POC: NONE SEEN
Sperm: NONE SEEN
Trich, Wet Prep: NONE SEEN
Yeast Wet Prep HPF POC: NONE SEEN

## 2018-12-09 LAB — CBC WITH DIFFERENTIAL/PLATELET
Abs Immature Granulocytes: 0.03 10*3/uL (ref 0.00–0.07)
Basophils Absolute: 0 10*3/uL (ref 0.0–0.1)
Basophils Relative: 0 %
Eosinophils Absolute: 0 10*3/uL (ref 0.0–0.5)
Eosinophils Relative: 1 %
HCT: 26.7 % — ABNORMAL LOW (ref 36.0–46.0)
Hemoglobin: 9.6 g/dL — ABNORMAL LOW (ref 12.0–15.0)
Immature Granulocytes: 0 %
Lymphocytes Relative: 17 %
Lymphs Abs: 1.4 10*3/uL (ref 0.7–4.0)
MCH: 35.2 pg — ABNORMAL HIGH (ref 26.0–34.0)
MCHC: 36 g/dL (ref 30.0–36.0)
MCV: 97.8 fL (ref 80.0–100.0)
Monocytes Absolute: 0.4 10*3/uL (ref 0.1–1.0)
Monocytes Relative: 5 %
Neutro Abs: 6.5 10*3/uL (ref 1.7–7.7)
Neutrophils Relative %: 77 %
Platelets: 304 10*3/uL (ref 150–400)
RBC: 2.73 MIL/uL — ABNORMAL LOW (ref 3.87–5.11)
RDW: 13.2 % (ref 11.5–15.5)
WBC: 8.4 10*3/uL (ref 4.0–10.5)
nRBC: 0 % (ref 0.0–0.2)

## 2018-12-09 LAB — URINALYSIS, ROUTINE W REFLEX MICROSCOPIC
Bacteria, UA: NONE SEEN
Bilirubin Urine: NEGATIVE
Glucose, UA: NEGATIVE mg/dL
Ketones, ur: NEGATIVE mg/dL
Leukocytes,Ua: NEGATIVE
Nitrite: NEGATIVE
Protein, ur: NEGATIVE mg/dL
Specific Gravity, Urine: 1.02 (ref 1.005–1.030)
pH: 7 (ref 5.0–8.0)

## 2018-12-09 LAB — TYPE AND SCREEN
ABO/RH(D): A NEG
Antibody Screen: NEGATIVE

## 2018-12-09 LAB — RAPID URINE DRUG SCREEN, HOSP PERFORMED
Amphetamines: NOT DETECTED
Barbiturates: NOT DETECTED
Benzodiazepines: NOT DETECTED
Cocaine: NOT DETECTED
Opiates: NOT DETECTED
Tetrahydrocannabinol: NOT DETECTED

## 2018-12-09 LAB — POCT FERN TEST: POCT Fern Test: POSITIVE

## 2018-12-09 LAB — AMNISURE RUPTURE OF MEMBRANE (ROM) NOT AT ARMC: Amnisure ROM: POSITIVE

## 2018-12-09 LAB — SARS CORONAVIRUS 2 BY RT PCR (HOSPITAL ORDER, PERFORMED IN ~~LOC~~ HOSPITAL LAB): SARS Coronavirus 2: NEGATIVE

## 2018-12-09 MED ORDER — LACTATED RINGERS IV SOLN
INTRAVENOUS | Status: DC
  Administered 2018-12-09 – 2018-12-10 (×3): via INTRAVENOUS

## 2018-12-09 MED ORDER — LACTATED RINGERS IV SOLN: INTRAVENOUS | Status: DC

## 2018-12-09 MED ORDER — LIDOCAINE HCL (PF) 1 % IJ SOLN: 30.0000 mL | INTRAMUSCULAR | Status: DC | PRN

## 2018-12-09 MED ORDER — OXYTOCIN 40 UNITS IN NORMAL SALINE INFUSION - SIMPLE MED: 2.5000 [IU]/h | INTRAVENOUS | Status: DC

## 2018-12-09 MED ORDER — SODIUM CHLORIDE 0.9 % IV SOLN
2.0000 g | Freq: Four times a day (QID) | INTRAVENOUS | Status: AC
  Administered 2018-12-09 – 2018-12-11 (×8): 2 g via INTRAVENOUS
  Filled 2018-12-09 (×8): qty 2000

## 2018-12-09 MED ORDER — ZOLPIDEM TARTRATE 5 MG PO TABS: 5.0000 mg | ORAL_TABLET | Freq: Every evening | ORAL | Status: DC | PRN

## 2018-12-09 MED ORDER — ASPIRIN 81 MG PO CHEW
81.0000 mg | CHEWABLE_TABLET | Freq: Every day | ORAL | Status: DC
  Administered 2018-12-10 – 2019-01-11 (×33): 81 mg via ORAL
  Filled 2018-12-09 (×33): qty 1

## 2018-12-09 MED ORDER — OXYTOCIN BOLUS FROM INFUSION: 500.0000 mL | Freq: Once | INTRAVENOUS | Status: DC

## 2018-12-09 MED ORDER — DOCUSATE SODIUM 100 MG PO CAPS
100.0000 mg | ORAL_CAPSULE | Freq: Two times a day (BID) | ORAL | Status: DC | PRN
  Administered 2019-01-03: 100 mg via ORAL
  Filled 2018-12-09: qty 1

## 2018-12-09 MED ORDER — SOD CITRATE-CITRIC ACID 500-334 MG/5ML PO SOLN: 30.0000 mL | ORAL | Status: DC | PRN

## 2018-12-09 MED ORDER — AZITHROMYCIN 500 MG PO TABS
1000.0000 mg | ORAL_TABLET | Freq: Once | ORAL | Status: DC
  Filled 2018-12-09 (×2): qty 2

## 2018-12-09 MED ORDER — ACETAMINOPHEN 325 MG PO TABS
650.0000 mg | ORAL_TABLET | ORAL | Status: DC | PRN
  Administered 2019-01-11: 650 mg via ORAL
  Filled 2018-12-09: qty 2

## 2018-12-09 MED ORDER — ONDANSETRON HCL 4 MG/2ML IJ SOLN
4.0000 mg | Freq: Four times a day (QID) | INTRAMUSCULAR | Status: DC | PRN
  Administered 2018-12-09: 20:00:00 4 mg via INTRAVENOUS
  Filled 2018-12-09: qty 2

## 2018-12-09 MED ORDER — FERROUS GLUCONATE 324 (38 FE) MG PO TABS
324.0000 mg | ORAL_TABLET | Freq: Every day | ORAL | Status: DC
  Administered 2018-12-10 – 2019-01-11 (×33): 324 mg via ORAL
  Filled 2018-12-09 (×35): qty 1

## 2018-12-09 MED ORDER — MAGNESIUM SULFATE 40 G IN LACTATED RINGERS - SIMPLE
2.0000 g/h | INTRAVENOUS | Status: DC
  Administered 2018-12-09 – 2018-12-11 (×3): 2 g/h via INTRAVENOUS
  Filled 2018-12-09 (×3): qty 500

## 2018-12-09 MED ORDER — BETAMETHASONE SOD PHOS & ACET 6 (3-3) MG/ML IJ SUSP
12.0000 mg | INTRAMUSCULAR | Status: AC
  Administered 2018-12-09 – 2018-12-10 (×2): 12 mg via INTRAMUSCULAR
  Filled 2018-12-09 (×3): qty 2

## 2018-12-09 MED ORDER — MAGNESIUM SULFATE BOLUS VIA INFUSION
4.0000 g | Freq: Once | INTRAVENOUS | Status: AC
  Administered 2018-12-09: 4 g via INTRAVENOUS
  Filled 2018-12-09: qty 500

## 2018-12-09 MED ORDER — LACTATED RINGERS IV SOLN: 500.0000 mL | INTRAVENOUS | Status: DC | PRN

## 2018-12-09 MED ORDER — AZITHROMYCIN 500 MG PO TABS
1000.0000 mg | ORAL_TABLET | Freq: Once | ORAL | Status: AC
  Administered 2018-12-09: 1000 mg via ORAL
  Filled 2018-12-09: qty 2

## 2018-12-09 MED ORDER — CALCIUM CARBONATE ANTACID 500 MG PO CHEW
2.0000 | CHEWABLE_TABLET | ORAL | Status: DC | PRN
  Administered 2018-12-21 – 2018-12-22 (×2): 400 mg via ORAL
  Filled 2018-12-09 (×2): qty 2

## 2018-12-09 MED ORDER — OXYCODONE-ACETAMINOPHEN 5-325 MG PO TABS: 2.0000 | ORAL_TABLET | ORAL | Status: DC | PRN

## 2018-12-09 MED ORDER — FENTANYL CITRATE (PF) 100 MCG/2ML IJ SOLN: 100.0000 ug | INTRAMUSCULAR | Status: DC | PRN

## 2018-12-09 MED ORDER — SODIUM CHLORIDE 0.9 % IV SOLN
500.0000 mg | Freq: Once | INTRAVENOUS | Status: AC
  Administered 2018-12-09: 500 mg via INTRAVENOUS
  Filled 2018-12-09: qty 500

## 2018-12-09 MED ORDER — OXYTOCIN 10 UNIT/ML IJ SOLN
10.0000 [IU] | Freq: Once | INTRAMUSCULAR | Status: DC | PRN
  Filled 2018-12-09: qty 1

## 2018-12-09 MED ORDER — OXYCODONE-ACETAMINOPHEN 5-325 MG PO TABS: 1.0000 | ORAL_TABLET | ORAL | Status: DC | PRN

## 2018-12-09 MED ORDER — PRENATAL MULTIVITAMIN CH
1.0000 | ORAL_TABLET | Freq: Every day | ORAL | Status: DC
  Administered 2018-12-10 – 2019-01-11 (×30): 1 via ORAL
  Filled 2018-12-09 (×32): qty 1

## 2018-12-09 MED ORDER — LACTATED RINGERS IV SOLN
INTRAVENOUS | Status: DC
  Administered 2018-12-10 – 2018-12-11 (×2): via INTRAVENOUS

## 2018-12-09 MED ORDER — RHO D IMMUNE GLOBULIN 1500 UNIT/2ML IJ SOSY
300.0000 ug | PREFILLED_SYRINGE | Freq: Once | INTRAMUSCULAR | Status: DC
  Filled 2018-12-09: qty 2

## 2018-12-09 MED ORDER — AMOXICILLIN 500 MG PO CAPS
500.0000 mg | ORAL_CAPSULE | Freq: Three times a day (TID) | ORAL | Status: AC
  Administered 2018-12-11 – 2018-12-16 (×15): 500 mg via ORAL
  Filled 2018-12-09 (×15): qty 1

## 2018-12-09 NOTE — H&P (Signed)
LABOR PROGRESS NOTE  Maria Reed is a 38 y.o. G3P0020 at [redacted]w[redacted]d  admitted for preterm premature rupture of membranes.  Pregnancy has been complicated by advanced maternal age.  Subjective: Patient reported that she woke up this morning in a puddle in her bed around 10:15 AM.  She then stood up and had a large gush of fluid from her vagina.  She is unsure if it was urine or her water breaking.  She called the after-hours line and they recommended she go to her primary OB doc.  She called them and they recommended her come here for evaluation for rupture membranes.anemia U she had an exam showed 1 cm dilation, had a positive ferning and positive in nature.  Patient denies feeling any contractions, reports good fetal movement, denies any vaginal bleeding.  Objective: BP 94/65 (BP Location: Right Arm)   Pulse 80   Temp 98.2 F (36.8 C) (Oral)   Resp 16   Ht 5\' 2"  (1.575 m)   Wt 48.4 kg   LMP 07/01/2018   SpO2 100%   BMI 19.52 kg/m  or  Vitals:   12/09/18 1159 12/09/18 1203  BP:  94/65  Pulse:  80  Resp:  16  Temp:  98.2 F (36.8 C)  TempSrc:  Oral  SpO2:  100%  Weight: 48.4 kg   Height: 5\' 2"  (1.575 m)     General: Alert, well-appearing Cardio: Regular rate and rhythm, no murmurs appreciated Respiratory: Clear to auscultation bilaterally  Genitourinary: Per MAU- Uterus: non-tender, S=D, SE: cervix is smooth, pink, no lesions, copious amt of clear fluid in vaginal vault -- WP, GC/CT and fern done, VE: 1/60/-3, no CMT or friability, no adnexal tenderness   Dilation: 1 Effacement (%): 60 Exam by:: Laury Deep CNM FHT: baseline rate 140, moderate varibility, Neg acel, No decel Toco: Contractions every 10 to 16 minutes  Labs: Lab Results  Component Value Date   WBC 7.9 09/17/2018   HGB 10.8 (L) 09/17/2018   HCT 31.7 (L) 09/17/2018   MCV 98 (H) 09/17/2018   PLT 285 09/17/2018    Patient Active Problem List   Diagnosis Date Noted  . Preterm premature rupture of  membranes (PPROM) with unknown onset of labor 12/09/2018  . Supervision of normal pregnancy, antepartum 09/17/2018  . Rh negative status during pregnancy 09/17/2018  . AMA (advanced maternal age) primigravida 35+ 09/17/2018    Assessment / Plan: 38 y.o. G3P0020 at [redacted]w[redacted]d here for PPROM.  Ferning positive, amnio sure positive.  Fetal tracing show category 1 strip with the fetal heart rate around 140, moderate variability, no A cells or decelerations.  Wet prep showed many WBCs, no clue cells, no trichomoniasis, no yeast.  UA was negative for UTI.  Preterm premature rupture membranes  - Magnesium 4 g loading started 1558 and 2 g/h infusion - Betamethasone 12 mg IM x2 doses in 24 hours, first dose- 1616 - Ampicillin 2 g first dose at 1624 followed by amoxicillin 500 mg 3 times daily - Azithromycin 1 g - Urine culture, CBC with differential, type and screen, RPR, HIV ordered - Tylenol 650 mg every 4 hours as needed for mild pain or fever greater than 100.4 F -Zofran 4 mg every 6 hours PRN for nausea - Percocet 5-3 25 every 4 hours as needed for pain scale greater or equal to fourth of  Rh- status during pregnancy  - Planning for RhoGam at 28 weeks    Gifford Shave, MD  PGY-1, [place]  12/09/18 2:32 PM

## 2018-12-09 NOTE — MAU Note (Signed)
Maria Reed is a 38 y.o. at [redacted]w[redacted]d here in MAU reporting: states this morning around 1014 states she had a gush of urine, was in a big puddle. When she goes to the bathroom it just gushes out. States it is clear like water, no odor. No bleeding, no pain. Was advised to come in by on call triage line.  Onset of complaint: today  Pain score: 0/10  Vitals:   12/09/18 1203  BP: 94/65  Pulse: 80  Resp: 16  Temp: 98.2 F (36.8 C)  SpO2: 100%     FHT: +FM  Lab orders placed from triage: UA

## 2018-12-09 NOTE — MAU Provider Note (Signed)
History     CSN: [id number]  Arrival date and time: 12/09/18 1149   First Provider Initiated Contact with Patient 12/09/18 1232      Chief Complaint  Patient presents with  . uncontrollable urination  . Rupture of Membranes   HPI  [name] is a 38 y.o. year old G67P0020 female at 60w0dweeks gestation who presents to MAU reporting while lying on her bed she felt a "gush of urine or fluid that left a big puddle on the bed @ 1014." She reports the fluid was clear "like water" and no odor. She is not sure if she leaked urine because the baby is kicking a lot. She states she went the BR to see if she can empty her bladder, but when she sat on the toilet fluid gushed out and she couldn't control it. She denies any VB or pain. She reports last SI was 3 days ago. She reports "lots" of (+) FM today.   Past Medical History:  Diagnosis Date  . [place]    Past Surgical History:  Procedure Laterality Date  . CERVIX SURGERY      No family history on file.  Social History   Tobacco Use  . Smoking status: Never Smoker  . Smokeless tobacco: Never Used  Substance Use Topics  . Alcohol use: Never    Frequency: Never  . Drug use: Never    Allergies: No Known Allergies  Medications Prior to Admission  Medication Sig Dispense Refill Last Dose  . aspirin EC 81 MG tablet Take 1 tablet (81 mg total) by mouth daily. Take after 12 weeks for prevention of preeclampsia later in pregnancy 300 tablet 2   . Blood Pressure Monitor KIT 1 Device by Does not apply route once a week. To be monitored Regularly at home. 1 kit 0   . Blood Pressure Monitoring KIT 1 kit by Does not apply route once a week. 1 kit 0   . cefadroxil (DURICEF) 500 MG capsule Take 1 capsule (500 mg total) by mouth 2 (two) times daily. (Patient not taking: Reported on 09/17/2018) 14 capsule 0   . ferrous sulfate (FERROUSUL) 325 (65 FE) MG tablet Take 1 tablet (325 mg total) by mouth 2 (two) times daily.  (Patient not taking: Reported on 11/13/2018) 60 tablet 1   . Iron-Vitamins (GERITOL COMPLETE PO) Take by mouth.     . Prenatal Vit-Fe Fumarate-FA (MULTIVITAMIN-PRENATAL) 27-0.8 MG TABS tablet Take 1 tablet by mouth daily at 12 noon.       Review of Systems  Constitutional: Negative.   HENT: Negative.   Eyes: Negative.   Respiratory: Negative.   Cardiovascular: Negative.   Gastrointestinal: Negative.   Endocrine: Negative.   Genitourinary: Positive for vaginal discharge (leaking clear, fluid since 1014).  Musculoskeletal: Negative.   Skin: Negative.   Allergic/Immunologic: Negative.   Neurological: Negative.   Hematological: Negative.   Psychiatric/Behavioral: Negative.    Physical Exam   Blood pressure 94/65, pulse 80, temperature 98.2 F (36.8 C), temperature source Oral, resp. rate 16, height 5' 2"  (1.575 m), weight 48.4 kg, last menstrual period 07/01/2018, SpO2 100 %.  Physical Exam  Nursing note and vitals reviewed. Constitutional: She is oriented to person, place, and time. She appears well-developed and well-nourished.  HENT:  Head: Normocephalic and atraumatic.  Eyes: Pupils are equal, round, and reactive to light.  Neck: Normal range of motion.  Cardiovascular: Normal rate, regular rhythm, normal heart sounds and intact distal  pulses.  Respiratory: Effort normal and breath sounds normal.  GI: Soft. Bowel sounds are normal.  Genitourinary:    Genitourinary Comments: Uterus: non-tender, S=D, SE: cervix is smooth, pink, no lesions, copious amt of clear fluid in vaginal vault -- WP, GC/CT and fern done, VE: 1/60/-3, no CMT or friability, no adnexal tenderness    Musculoskeletal: Normal range of motion.  Neurological: She is alert and oriented to person, place, and time. She has normal reflexes.  Skin: Skin is warm and dry.  Psychiatric: She has a normal mood and affect. Her behavior is normal. Judgment and thought content normal.   NST - FHR: 140 bpm / moderate  variability / accels present / decels absent / TOCO: regular every 2-3 mins (pt not feeling UC's)  MAU Course  Procedures Patient informed that the ultrasound is considered a limited OB ultrasound and is not intended to be a complete ultrasound exam.  Patient also informed that the ultrasound is not being completed with the intent of assessing for fetal or placental anomalies or any pelvic abnormalities.  Explained that the purpose of today's ultrasound is to assess for presentation.  Baby was found to be in a cephalic presentation. Patient acknowledges the purpose of the exam and the limitations of the study.  MDM CCUA Wet Prep GC/CT -- pending Fern Slide Amnisure IV: LR @ 125 ml/hr  Results for orders placed or performed during the hospital encounter of 12/09/18 (from the past 24 hour(s))  Urinalysis, Routine w reflex microscopic     Status: Abnormal   Collection Time: 12/09/18 12:18 PM  Result Value Ref Range   Color, Urine YELLOW YELLOW   APPearance CLEAR CLEAR   Specific Gravity, Urine 1.020 1.005 - 1.030   pH 7.0 5.0 - 8.0   Glucose, UA NEGATIVE NEGATIVE mg/dL   Hgb urine dipstick SMALL (A) NEGATIVE   Bilirubin Urine NEGATIVE NEGATIVE   Ketones, ur NEGATIVE NEGATIVE mg/dL   Protein, ur NEGATIVE NEGATIVE mg/dL   Nitrite NEGATIVE NEGATIVE   Leukocytes,Ua NEGATIVE NEGATIVE   RBC / HPF 0-5 0 - 5 RBC/hpf   WBC, UA 0-5 0 - 5 WBC/hpf   Bacteria, UA NONE SEEN NONE SEEN   Squamous Epithelial / LPF 0-5 0 - 5   Mucus PRESENT   Wet prep, genital     Status: Abnormal   Collection Time: 12/09/18 12:48 PM  Result Value Ref Range   Yeast Wet Prep HPF POC NONE SEEN NONE SEEN   Trich, Wet Prep NONE SEEN NONE SEEN   Clue Cells Wet Prep HPF POC NONE SEEN NONE SEEN   WBC, Wet Prep HPF POC MANY (A) NONE SEEN   Sperm NONE SEEN   POCT fern test     Status: None   Collection Time: 12/09/18 12:51 PM  Result Value Ref Range   POCT Fern Test Positive = ruptured amniotic membanes      Assessment and Plan  Preterm premature rupture of membranes (PPROM) with unknown onset of labor - Admit to L&D  - MgSO4 4 gram LD then 2 gram/hr by IV - BMZ 12 mg IM x 2 doses in 24 hrs - OB MFM F/U U/S - UCx - UDS - CBC with Diff - RPR - HIV - T&S - Amoxicillin/ampicillin/zithromycin per PTL protocol - Report of admission and care assumed by Dr. Early Chars @ 1422   Rh negative status during pregnancy in second trimester - Rhophylac W/U - Rhophylac 300 mcg IM injection  Laury Deep, MSN,  CNM 12/09/2018, 12:32 PM

## 2018-12-10 DIAGNOSIS — Z3A23 23 weeks gestation of pregnancy: Secondary | ICD-10-CM | POA: Diagnosis not present

## 2018-12-10 DIAGNOSIS — O42912 Preterm premature rupture of membranes, unspecified as to length of time between rupture and onset of labor, second trimester: Secondary | ICD-10-CM

## 2018-12-10 DIAGNOSIS — O09512 Supervision of elderly primigravida, second trimester: Secondary | ICD-10-CM | POA: Diagnosis not present

## 2018-12-10 LAB — RH IG WORKUP (INCLUDES ABO/RH)
ABO/RH(D): A NEG
Fetal Screen: NEGATIVE
Gestational Age(Wks): 23
Unit division: 0

## 2018-12-10 LAB — CULTURE, OB URINE: Culture: NO GROWTH

## 2018-12-10 LAB — COMPREHENSIVE METABOLIC PANEL
ALT: 15 U/L (ref 0–44)
AST: 16 U/L (ref 15–41)
Albumin: 2.7 g/dL — ABNORMAL LOW (ref 3.5–5.0)
Alkaline Phosphatase: 36 U/L — ABNORMAL LOW (ref 38–126)
Anion gap: 9 (ref 5–15)
BUN: <5 mg/dL — ABNORMAL LOW (ref 6–20)
CO2: 21 mmol/L — ABNORMAL LOW (ref 22–32)
Calcium: 6.4 mg/dL — CL (ref 8.9–10.3)
Chloride: 105 mmol/L (ref 98–111)
Creatinine, Ser: 0.59 mg/dL (ref 0.44–1.00)
GFR calc Af Amer: >60 mL/min (ref >60)
GFR calc non Af Amer: >60 mL/min (ref >60)
Glucose, Bld: 123 mg/dL — ABNORMAL HIGH (ref 70–99)
Potassium: 3.8 mmol/L (ref 3.5–5.1)
Sodium: 135 mmol/L (ref 135–145)
Total Bilirubin: 0.2 mg/dL — ABNORMAL LOW (ref 0.3–1.2)
Total Protein: 6.1 g/dL — ABNORMAL LOW (ref 6.5–8.1)

## 2018-12-10 LAB — RPR: RPR Ser Ql: NONREACTIVE

## 2018-12-10 LAB — MAGNESIUM: Magnesium: 6.4 mg/dL (ref 1.7–2.4)

## 2018-12-10 MED ORDER — CALCIUM CARBONATE ANTACID 500 MG PO CHEW
1.0000 | CHEWABLE_TABLET | Freq: Two times a day (BID) | ORAL | Status: AC
  Administered 2018-12-10 – 2018-12-11 (×4): 200 mg via ORAL
  Filled 2018-12-10 (×4): qty 1

## 2018-12-10 NOTE — Consult Note (Signed)
Neonatology Antenatal Consultation Requested: Dr. Fulton Reek Reason: PPROM at 23 weeks  I conferred with [name] this afternoon and described the usual kinds of intensive care and duration of hospital stay for a baby born at [redacted] weeks gestation. I noted that survival was decreased at this gestational age, and that problems with lung, gut, and brain development would necessitate a prolonged NICU stay.  She was focused on the steps being taken to maintain the pregnancy and said she was very aware that the baby would be at great risk if he were born soon.  I told her that we would be monitoring her course and would come back to answer further questions if she had them, or if it appeared that active labor would threaten early delivery.  R.L. Patterson Hammersmith, M.D.

## 2018-12-10 NOTE — Progress Notes (Signed)
[place] PROGRESS NOTE  Maria Reed is a 38 y.o. G3P0020 at [redacted]w[redacted]d who is admitted for PROM.  Estimated Date of Delivery: 04/07/19 Fetal presentation is cephalic.  Length of Stay:  1 Days. Admitted 12/09/2018  Subjective:  Patient reports normal fetal movement.  She denies uterine contractions, denies bleeding. She is feeling well with no complaints.  Vitals:  Blood pressure 97/61, pulse 91, temperature 97.7 F (36.5 C), resp. rate 18, height 5\' 2"  (1.575 m), weight 48.4 kg, last menstrual period 07/01/2018, SpO2 95 %. Physical Examination: CONSTITUTIONAL: Well-developed, well-nourished female in no acute distress.  HENT:  Normocephalic, atraumatic, External right and left ear normal. Oropharynx is clear and moist EYES: Conjunctivae and EOM are normal. Pupils are equal, round, and reactive to light. No scleral icterus.  NECK: Normal range of motion, supple, no masses. SKIN: Skin is warm and dry. No rash noted. Not diaphoretic. No erythema. No pallor. [place]: Alert and oriented to person, place, and time. Normal reflexes, muscle tone coordination. No cranial nerve deficit noted. PSYCHIATRIC: Normal mood and affect. Normal behavior. Normal judgment and thought content. CARDIOVASCULAR: Normal heart rate noted RESPIRATORY: Effort normal, no problems with respiration noted MUSCULOSKELETAL: Normal range of motion. No edema and no tenderness. ABDOMEN: Soft, nontender, nondistended, gravid. CERVIX: deferred  Fetal monitoring: FHR: 135 bpm, Variability: moderate, Accelerations: Present, Decelerations: Absent  Uterine activity: no contractions per hour  Results for orders placed or performed during the hospital encounter of 12/09/18 (from the past 48 hour(s))  Urinalysis, Routine w reflex microscopic     Status: Abnormal   Collection Time: 12/09/18 12:18 PM  Result Value Ref Range   Color, Urine YELLOW YELLOW   APPearance CLEAR CLEAR   Specific Gravity, Urine 1.020  1.005 - 1.030   pH 7.0 5.0 - 8.0   Glucose, UA NEGATIVE NEGATIVE mg/dL   Hgb urine dipstick SMALL (A) NEGATIVE   Bilirubin Urine NEGATIVE NEGATIVE   Ketones, ur NEGATIVE NEGATIVE mg/dL   Protein, ur NEGATIVE NEGATIVE mg/dL   Nitrite NEGATIVE NEGATIVE   Leukocytes,Ua NEGATIVE NEGATIVE   RBC / HPF 0-5 0 - 5 RBC/hpf   WBC, UA 0-5 0 - 5 WBC/hpf   Bacteria, UA NONE SEEN NONE SEEN   Squamous Epithelial / LPF 0-5 0 - 5   Mucus PRESENT     Comment: Performed at [facility], [place] [address], [place], [place] [address]  OB Urine Culture     Status: None   Collection Time: 12/09/18 12:18 PM   Specimen: Urine, Random  Result Value Ref Range   Specimen Description URINE, RANDOM    Special Requests NONE    Culture      NO GROWTH NO GROUP B STREP (S.AGALACTIAE) ISOLATED Performed at [facility], [place] [address] [place], [place] [address]    Report Status 12/10/2018 FINAL   Urine rapid drug screen (hosp performed)     Status: None   Collection Time: 12/09/18 12:18 PM  Result Value Ref Range   Opiates NONE DETECTED NONE DETECTED   Cocaine NONE DETECTED NONE DETECTED   Benzodiazepines NONE DETECTED NONE DETECTED   Amphetamines NONE DETECTED NONE DETECTED   Tetrahydrocannabinol NONE DETECTED NONE DETECTED   Barbiturates NONE DETECTED NONE DETECTED    Comment: (NOTE) DRUG SCREEN FOR MEDICAL PURPOSES ONLY.  IF CONFIRMATION IS NEEDED FOR ANY PURPOSE, NOTIFY LAB WITHIN 5 DAYS. LOWEST DETECTABLE LIMITS FOR URINE DRUG SCREEN Drug Class  Cutoff (ng/mL) Amphetamine and metabolites    1000 Barbiturate and metabolites    200 Benzodiazepine                 [id number] Tricyclics and metabolites     300 Opiates and metabolites        300 Cocaine and metabolites        300 THC                            50 Performed at [facility], [place] [address], [place], [place] [address]   CBC with Differential/Platelet     Status: Abnormal   Collection Time: 12/09/18 12:18 PM   Result Value Ref Range   WBC 8.4 4.0 - 10.5 K/uL   RBC 2.73 (L) 3.87 - 5.11 MIL/uL   Hemoglobin 9.6 (L) 12.0 - 15.0 g/dL   HCT 26.7 (L) 36.0 - 46.0 %   MCV 97.8 80.0 - 100.0 fL   MCH 35.2 (H) 26.0 - 34.0 pg   MCHC 36.0 30.0 - 36.0 g/dL   RDW 13.2 11.5 - 15.5 %   Platelets 304 150 - 400 K/uL   nRBC 0.0 0.0 - 0.2 %   Neutrophils Relative % 77 %   Neutro Abs 6.5 1.7 - 7.7 K/uL   Lymphocytes Relative 17 %   Lymphs Abs 1.4 0.7 - 4.0 K/uL   Monocytes Relative 5 %   Monocytes Absolute 0.4 0.1 - 1.0 K/uL   Eosinophils Relative 1 %   Eosinophils Absolute 0.0 0.0 - 0.5 K/uL   Basophils Relative 0 %   Basophils Absolute 0.0 0.0 - 0.1 K/uL   Immature Granulocytes 0 %   Abs Immature Granulocytes 0.03 0.00 - 0.07 K/uL    Comment: Performed at [facility], [address] [place], [place] [address]  Amnisure rupture of membrane (rom)not at [facility]     Status: None   Collection Time: 12/09/18 12:48 PM  Result Value Ref Range   Amnisure ROM POSITIVE     Comment: Performed at [facility], [address], [place], [place] [address]  Wet prep, genital     Status: Abnormal   Collection Time: 12/09/18 12:48 PM  Result Value Ref Range   Yeast Wet Prep HPF POC NONE SEEN NONE SEEN   Trich, Wet Prep NONE SEEN NONE SEEN   Clue Cells Wet Prep HPF POC NONE SEEN NONE SEEN   WBC, Wet Prep HPF POC MANY (A) NONE SEEN   Sperm NONE SEEN     Comment: Performed at [facility], [place] [address] [place], [place] [address]  POCT fern test     Status: None   Collection Time: 12/09/18 12:51 PM  Result Value Ref Range   POCT Fern Test Positive = ruptured amniotic membanes   Rh IG workup (includes ABO/Rh)     Status: None   Collection Time: 12/09/18  2:52 PM  Result Value Ref Range   Gestational Age(Wks) 23    ABO/RH(D) A NEG    Fetal Screen NEG    Unit Number [id number]    Blood Component Type RHIG    Unit division 00    Status of Unit REL FROM [facility]    Transfusion Status OK TO  TRANSFUSE   SARS Coronavirus 2 [facility] order, Performed in [facility] hospital lab) Nasopharyngeal Nasopharyngeal Swab     Status: None   Collection Time: 12/09/18  2:52 PM   Specimen: Nasopharyngeal Swab  Result Value Ref Range   SARS Coronavirus 2 NEGATIVE NEGATIVE    Comment: (NOTE) If result is NEGATIVE SARS-CoV-2 target nucleic acids are NOT DETECTED. The SARS-CoV-2 RNA is generally detectable in upper and lower  respiratory specimens during the acute phase of infection. The lowest  concentration of SARS-CoV-2 viral copies this assay can detect is 250  copies / mL. A negative result does not preclude SARS-CoV-2 infection  and should not be used as the sole basis for treatment or other  patient management decisions.  A negative result may occur with  improper specimen collection / handling, submission of specimen other  than nasopharyngeal swab, presence of viral mutation(s) within the  areas targeted by this assay, and inadequate number of viral copies  (<250 copies / mL). A negative result must be combined with clinical  observations, patient history, and epidemiological information. If result is POSITIVE SARS-CoV-2 target nucleic acids are DETECTED. The SARS-CoV-2 RNA is generally detectable in upper and lower  respiratory specimens dur ing the acute phase of infection.  Positive  results are indicative of active infection with SARS-CoV-2.  Clinical  correlation with patient history and other diagnostic information is  necessary to determine patient infection status.  Positive results do  not rule out bacterial infection or co-infection with other viruses. If result is PRESUMPTIVE POSTIVE SARS-CoV-2 nucleic acids MAY BE PRESENT.   A presumptive positive result was obtained on the submitted specimen  and confirmed on repeat testing.  While 2019 novel coronavirus  (SARS-CoV-2) nucleic acids may be present in the submitted sample  additional confirmatory testing may be  necessary for epidemiological  and / or clinical management purposes  to differentiate between  SARS-CoV-2 and other Sarbecovirus currently known to infect humans.  If clinically indicated additional testing with an alternate test  methodology [phone]) is advised. The SARS-CoV-2 RNA is generally  detectable in upper and lower respiratory sp ecimens during the acute  phase of infection. The expected result is Negative. Fact Sheet for Patients:  StrictlyIdeas.no Fact Sheet for Healthcare Providers: BankingDealers.co.za This test is not yet approved or cleared by the [place] FDA and has been authorized for detection and/or diagnosis of SARS-CoV-2 by FDA under an Emergency Use Authorization (EUA).  This EUA will remain in effect (meaning this test can be used) for the duration of the COVID-19 declaration under Section 564(b)(1) of the Act, 21 U.S.C. section 360bbb-3(b)(1), unless the authorization is terminated or revoked sooner. Performed at [facility], [place] [address], [place], [place] [address]   Type and screen [place]     Status: None   Collection Time: 12/09/18  2:52 PM  Result Value Ref Range   ABO/RH(D) A NEG    Antibody Screen NEG    Sample Expiration      12/12/2018,2359 Performed at [facility], [place] [address], [place], [place] [address]   RPR     Status: None   Collection Time: 12/09/18  2:52 PM  Result Value Ref Range   RPR Ser Ql NON REACTIVE NON REACTIVE    Comment: Performed at [facility], [place] [address] [place], [place] [address]  Magnesium     Status: Abnormal   Collection Time: 12/10/18  3:32 PM  Result Value Ref Range   Magnesium 6.4 (HH) 1.7 - 2.4 mg/dL    Comment: CRITICAL RESULT CALLED TO, READ BACK BY AND VERIFIED WITH: Lyman Speller  1638 12/10/2018 WBOND Performed at [place] [address], [place], [place] [address]   Comprehensive metabolic panel      Status: Abnormal   Collection Time: 12/10/18  3:32 PM  Result Value Ref Range   Sodium 135 135 - 145 mmol/L   Potassium 3.8 3.5 - 5.1 mmol/L   Chloride 105 98 - 111 mmol/L   CO2 21 (L) 22 - 32 mmol/L   Glucose, Bld 123 (H) 70 - 99 mg/dL   BUN <5 (L) 6 - 20 mg/dL   Creatinine, Ser 0.59 0.44 - 1.00 mg/dL   Calcium 6.4 (LL) 8.9 - 10.3 mg/dL    Comment: CRITICAL RESULT CALLED TO, READ BACK BY AND VERIFIED WITH:  [facility]  1638 12/10/2018 WBOND    Total Protein 6.1 (L) 6.5 - 8.1 g/dL   Albumin 2.7 (L) 3.5 - 5.0 g/dL   AST 16 15 - 41 U/L   ALT 15 0 - 44 U/L   Alkaline Phosphatase 36 (L) 38 - 126 U/L   Total Bilirubin 0.2 (L) 0.3 - 1.2 mg/dL   GFR calc non Af Amer >60 >60 mL/min   GFR calc Af Amer >60 >60 mL/min   Anion gap 9 5 - 15    Comment: Performed at [facility], [place] [address] [place], [place] [address]    I have reviewed the patient's current medications.  ASSESSMENT: Active Problems:   Rh negative status during pregnancy   AMA (advanced maternal age) primigravida 35+   Preterm premature rupture of membranes (PPROM) with unknown onset of labor   PLAN: Patient affirms she would like everything done for infant Reviewed plan for inpatient management until delivery Reviewed need for possible classical c-section, risks of c-sections including infection, hemorrhage, damage to surrounding tissue and organs, she verbalizes understanding Cont BTMZ Cont mag until steroid complete Cont latency abx Daily NST   Continue routine antenatal care.   Feliz Beam, M.D. Attending [facility] for [employer] (Faculty Practice)  12/10/2018 4:46 PM

## 2018-12-10 NOTE — Progress Notes (Signed)
CRITICAL VALUE ALERT  Critical Value:  Ca 6.4, Mag 6.4  Date & Time Notied:  12/10/2018 @ [id number]  Provider Notified: K. Davis  Orders Received/Actions taken: See orders/notes

## 2018-12-11 ENCOUNTER — Encounter: Payer: Medicaid Other | Admitting: Obstetrics

## 2018-12-11 DIAGNOSIS — O42912 Preterm premature rupture of membranes, unspecified as to length of time between rupture and onset of labor, second trimester: Secondary | ICD-10-CM | POA: Diagnosis not present

## 2018-12-11 DIAGNOSIS — O26892 Other specified pregnancy related conditions, second trimester: Secondary | ICD-10-CM

## 2018-12-11 DIAGNOSIS — Z3A23 23 weeks gestation of pregnancy: Secondary | ICD-10-CM | POA: Diagnosis not present

## 2018-12-11 LAB — GC/CHLAMYDIA PROBE AMP (~~LOC~~) NOT AT ARMC
Chlamydia: NEGATIVE
Neisseria Gonorrhea: NEGATIVE

## 2018-12-11 LAB — KLEIHAUER-BETKE STAIN
# Vials RhIg: 1
Fetal Cells %: 0 %
Quantitation Fetal Hemoglobin: 0 mL

## 2018-12-11 MED ORDER — MAGNESIUM SULFATE 40 GM/1000ML IV SOLN
2.0000 g/h | INTRAVENOUS | Status: AC
  Filled 2018-12-11 (×2): qty 1000

## 2018-12-11 MED ORDER — RHO D IMMUNE GLOBULIN 1500 UNIT/2ML IJ SOSY
300.0000 ug | PREFILLED_SYRINGE | Freq: Once | INTRAMUSCULAR | Status: DC
  Filled 2018-12-11: qty 2

## 2018-12-11 MED ORDER — SODIUM CHLORIDE 0.9% FLUSH
3.0000 mL | Freq: Two times a day (BID) | INTRAVENOUS | Status: DC
  Administered 2018-12-11 – 2018-12-27 (×25): 3 mL via INTRAVENOUS

## 2018-12-11 NOTE — Plan of Care (Signed)
  Problem: Education: Goal: Knowledge of disease or condition will improve Outcome: Progressing   

## 2018-12-11 NOTE — Progress Notes (Signed)
[place] PROGRESS NOTE  Maria Reed is a 38 y.o. G3P0020 at [redacted]w[redacted]d who is admitted for PROM.  Estimated Date of Delivery: 04/07/19 Fetal presentation is cephalic.  Length of Stay:  2 Days. Admitted 12/09/2018  Subjective: No sentinel events ovenight.  Patient reports normal fetal movement.  She denies uterine contractions, denies bleeding. She is feeling well with no complaints.  Vitals:  Blood pressure (!) 99/59, pulse 82, temperature 97.7 F (36.5 C), temperature source Oral, resp. rate 17, height 5\' 2"  (1.575 m), weight 48.4 kg, last menstrual period 07/01/2018, SpO2 100 %. Physical Examination: CONSTITUTIONAL: Well-developed, well-nourished female in no acute distress.  HENT:  Normocephalic, atraumatic, External right and left ear normal. Oropharynx is clear and moist EYES: Conjunctivae and EOM are normal. Pupils are equal, round, and reactive to light. No scleral icterus.  NECK: Normal range of motion, supple, no masses. SKIN: Skin is warm and dry. No rash noted. Not diaphoretic. No erythema. No pallor. [place]: Alert and oriented to person, place, and time. Normal reflexes, muscle tone coordination. No cranial nerve deficit noted. PSYCHIATRIC: Normal mood and affect. Normal behavior. Normal judgment and thought content. CARDIOVASCULAR: Normal heart rate noted RESPIRATORY: Effort normal, no problems with respiration noted MUSCULOSKELETAL: Normal range of motion. No edema and no tenderness. ABDOMEN: Soft, nontender, nondistended, gravid. CERVIX: deferred  Fetal monitoring: FHR: 125 bpm, Variability: moderate, Accelerations: Present, Decelerations: Absent  Uterine activity: no contractions per hour  Results for orders placed or performed during the hospital encounter of 12/09/18 (from the past 48 hour(s))  Urinalysis, Routine w reflex microscopic     Status: Abnormal   Collection Time: 12/09/18 12:18 PM  Result Value Ref Range   Color, Urine YELLOW YELLOW   APPearance CLEAR CLEAR   Specific Gravity, Urine 1.020 1.005 - 1.030   pH 7.0 5.0 - 8.0   Glucose, UA NEGATIVE NEGATIVE mg/dL   Hgb urine dipstick SMALL (A) NEGATIVE   Bilirubin Urine NEGATIVE NEGATIVE   Ketones, ur NEGATIVE NEGATIVE mg/dL   Protein, ur NEGATIVE NEGATIVE mg/dL   Nitrite NEGATIVE NEGATIVE   Leukocytes,Ua NEGATIVE NEGATIVE   RBC / HPF 0-5 0 - 5 RBC/hpf   WBC, UA 0-5 0 - 5 WBC/hpf   Bacteria, UA NONE SEEN NONE SEEN   Squamous Epithelial / LPF 0-5 0 - 5   Mucus PRESENT     Comment: Performed at [facility], [place] [address] [place], [place] [address]  OB Urine Culture     Status: None   Collection Time: 12/09/18 12:18 PM   Specimen: Urine, Random  Result Value Ref Range   Specimen Description URINE, RANDOM    Special Requests NONE    Culture      NO GROWTH NO GROUP B STREP (S.AGALACTIAE) ISOLATED Performed at [facility], [place] [address], [place], [place] [address]    Report Status 12/10/2018 FINAL   Urine rapid drug screen (hosp performed)     Status: None   Collection Time: 12/09/18 12:18 PM  Result Value Ref Range   Opiates NONE DETECTED NONE DETECTED   Cocaine NONE DETECTED NONE DETECTED   Benzodiazepines NONE DETECTED NONE DETECTED   Amphetamines NONE DETECTED NONE DETECTED   Tetrahydrocannabinol NONE DETECTED NONE DETECTED   Barbiturates NONE DETECTED NONE DETECTED    Comment: (NOTE) DRUG SCREEN FOR MEDICAL PURPOSES ONLY.  IF CONFIRMATION IS NEEDED FOR ANY PURPOSE, NOTIFY LAB WITHIN 5 DAYS. LOWEST DETECTABLE LIMITS FOR URINE DRUG SCREEN Drug Class  Cutoff (ng/mL) Amphetamine and metabolites    1000 Barbiturate and metabolites    200 Benzodiazepine                 [id number] Tricyclics and metabolites     300 Opiates and metabolites        300 Cocaine and metabolites        300 THC                            50 Performed at [facility], [place] [address], [place], [place] [address]   CBC with Differential/Platelet      Status: Abnormal   Collection Time: 12/09/18 12:18 PM  Result Value Ref Range   WBC 8.4 4.0 - 10.5 K/uL   RBC 2.73 (L) 3.87 - 5.11 MIL/uL   Hemoglobin 9.6 (L) 12.0 - 15.0 g/dL   HCT 26.7 (L) 36.0 - 46.0 %   MCV 97.8 80.0 - 100.0 fL   MCH 35.2 (H) 26.0 - 34.0 pg   MCHC 36.0 30.0 - 36.0 g/dL   RDW 13.2 11.5 - 15.5 %   Platelets 304 150 - 400 K/uL   nRBC 0.0 0.0 - 0.2 %   Neutrophils Relative % 77 %   Neutro Abs 6.5 1.7 - 7.7 K/uL   Lymphocytes Relative 17 %   Lymphs Abs 1.4 0.7 - 4.0 K/uL   Monocytes Relative 5 %   Monocytes Absolute 0.4 0.1 - 1.0 K/uL   Eosinophils Relative 1 %   Eosinophils Absolute 0.0 0.0 - 0.5 K/uL   Basophils Relative 0 %   Basophils Absolute 0.0 0.0 - 0.1 K/uL   Immature Granulocytes 0 %   Abs Immature Granulocytes 0.03 0.00 - 0.07 K/uL    Comment: Performed at [facility], [address], [place], [place] [address]  Amnisure rupture of membrane (rom)not at [facility]     Status: None   Collection Time: 12/09/18 12:48 PM  Result Value Ref Range   Amnisure ROM POSITIVE     Comment: Performed at [facility], [address] [place], [place] [address]  Wet prep, genital     Status: Abnormal   Collection Time: 12/09/18 12:48 PM  Result Value Ref Range   Yeast Wet Prep HPF POC NONE SEEN NONE SEEN   Trich, Wet Prep NONE SEEN NONE SEEN   Clue Cells Wet Prep HPF POC NONE SEEN NONE SEEN   WBC, Wet Prep HPF POC MANY (A) NONE SEEN   Sperm NONE SEEN     Comment: Performed at [facility], [place] [address], [place], [place] [address]  POCT fern test     Status: None   Collection Time: 12/09/18 12:51 PM  Result Value Ref Range   POCT Fern Test Positive = ruptured amniotic membanes   Rh IG workup (includes ABO/Rh)     Status: None   Collection Time: 12/09/18  2:52 PM  Result Value Ref Range   Gestational Age(Wks) 23    ABO/RH(D) A NEG    Fetal Screen NEG    Unit Number [id number]    Blood Component Type RHIG    Unit division 00    Status of  Unit REL FROM [facility]    Transfusion Status OK TO TRANSFUSE   SARS Coronavirus 2 [facility] order, Performed in [facility] hospital lab) Nasopharyngeal Nasopharyngeal Swab     Status: None   Collection Time: 12/09/18  2:52 PM   Specimen: Nasopharyngeal Swab  Result Value Ref Range   SARS Coronavirus 2 NEGATIVE NEGATIVE    Comment: (NOTE) If result is NEGATIVE SARS-CoV-2 target nucleic acids are NOT DETECTED. The SARS-CoV-2 RNA is generally detectable in upper and lower  respiratory specimens during the acute phase of infection. The lowest  concentration of SARS-CoV-2 viral copies this assay can detect is 250  copies / mL. A negative result does not preclude SARS-CoV-2 infection  and should not be used as the sole basis for treatment or other  patient management decisions.  A negative result may occur with  improper specimen collection / handling, submission of specimen other  than nasopharyngeal swab, presence of viral mutation(s) within the  areas targeted by this assay, and inadequate number of viral copies  (<250 copies / mL). A negative result must be combined with clinical  observations, patient history, and epidemiological information. If result is POSITIVE SARS-CoV-2 target nucleic acids are DETECTED. The SARS-CoV-2 RNA is generally detectable in upper and lower  respiratory specimens dur ing the acute phase of infection.  Positive  results are indicative of active infection with SARS-CoV-2.  Clinical  correlation with patient history and other diagnostic information is  necessary to determine patient infection status.  Positive results do  not rule out bacterial infection or co-infection with other viruses. If result is PRESUMPTIVE POSTIVE SARS-CoV-2 nucleic acids MAY BE PRESENT.   A presumptive positive result was obtained on the submitted specimen  and confirmed on repeat testing.  While 2019 novel coronavirus  (SARS-CoV-2) nucleic acids may be present in the submitted  sample  additional confirmatory testing may be necessary for epidemiological  and / or clinical management purposes  to differentiate between  SARS-CoV-2 and other Sarbecovirus currently known to infect humans.  If clinically indicated additional testing with an alternate test  methodology [phone]) is advised. The SARS-CoV-2 RNA is generally  detectable in upper and lower respiratory sp ecimens during the acute  phase of infection. The expected result is Negative. Fact Sheet for Patients:  StrictlyIdeas.no Fact Sheet for Healthcare Providers: BankingDealers.co.za This test is not yet approved or cleared by the [place] FDA and has been authorized for detection and/or diagnosis of SARS-CoV-2 by FDA under an Emergency Use Authorization (EUA).  This EUA will remain in effect (meaning this test can be used) for the duration of the COVID-19 declaration under Section 564(b)(1) of the Act, 21 U.S.C. section 360bbb-3(b)(1), unless the authorization is terminated or revoked sooner. Performed at [facility], [place] [address] [place], [place] [address]   Type and screen [place]     Status: None   Collection Time: 12/09/18  2:52 PM  Result Value Ref Range   ABO/RH(D) A NEG    Antibody Screen NEG    Sample Expiration      12/12/2018,2359 Performed at [facility], [place] [address], [place], [place] [address]   RPR     Status: None   Collection Time: 12/09/18  2:52 PM  Result Value Ref Range   RPR Ser Ql NON REACTIVE NON REACTIVE    Comment: Performed at [facility], [place] [address], [place], [place] [address]  Magnesium     Status: Abnormal   Collection Time: 12/10/18  3:32 PM  Result Value Ref Range   Magnesium 6.4 (HH) 1.7 - 2.4 mg/dL    Comment: CRITICAL RESULT CALLED TO, READ BACK BY AND VERIFIED WITH: Lyman Speller  1638 12/10/2018 WBOND Performed at [place] [address]  [place], [place] [address]   Comprehensive metabolic panel     Status: Abnormal   Collection Time: 12/10/18  3:32 PM  Result Value Ref Range   Sodium 135 135 - 145 mmol/L   Potassium 3.8 3.5 - 5.1 mmol/L   Chloride 105 98 - 111 mmol/L   CO2 21 (L) 22 - 32 mmol/L   Glucose, Bld 123 (H) 70 - 99 mg/dL   BUN <5 (L) 6 - 20 mg/dL   Creatinine, Ser 0.59 0.44 - 1.00 mg/dL   Calcium 6.4 (LL) 8.9 - 10.3 mg/dL    Comment: CRITICAL RESULT CALLED TO, READ BACK BY AND VERIFIED WITH:  J [facility]  1638 12/10/2018 WBOND    Total Protein 6.1 (L) 6.5 - 8.1 g/dL   Albumin 2.7 (L) 3.5 - 5.0 g/dL   AST 16 15 - 41 U/L   ALT 15 0 - 44 U/L   Alkaline Phosphatase 36 (L) 38 - 126 U/L   Total Bilirubin 0.2 (L) 0.3 - 1.2 mg/dL   GFR calc non Af Amer >60 >60 mL/min   GFR calc Af Amer >60 >60 mL/min   Anion gap 9 5 - 15    Comment: Performed at [facility], [place] [address], [place], [place] [address]    I have reviewed the patient's current medications.  ASSESSMENT: Active Problems:   Rh negative status during pregnancy   AMA (advanced maternal age) primigravida 35+   Preterm premature rupture of membranes (PPROM) with unknown onset of labor   PLAN: Reviewed plan for inpatient management until delivery, need for possible classical c-section Cont BTMZ Cont mag until steroid complete Cont latency abx Daily NST Continue routine antenatal care.   Verita Schneiders, MD Attending Center for [employer] (Faculty Practice) 12/11/2018 7:42 AM

## 2018-12-12 DIAGNOSIS — O42912 Preterm premature rupture of membranes, unspecified as to length of time between rupture and onset of labor, second trimester: Secondary | ICD-10-CM | POA: Diagnosis not present

## 2018-12-12 DIAGNOSIS — Z3A23 23 weeks gestation of pregnancy: Secondary | ICD-10-CM | POA: Diagnosis not present

## 2018-12-12 LAB — CBC
HCT: 24.3 % — ABNORMAL LOW (ref 36.0–46.0)
Hemoglobin: 8 g/dL — ABNORMAL LOW (ref 12.0–15.0)
MCH: 33.9 pg (ref 26.0–34.0)
MCHC: 32.9 g/dL (ref 30.0–36.0)
MCV: 103 fL — ABNORMAL HIGH (ref 80.0–100.0)
Platelets: 282 10*3/uL (ref 150–400)
RBC: 2.36 MIL/uL — ABNORMAL LOW (ref 3.87–5.11)
RDW: 14 % (ref 11.5–15.5)
WBC: 11.6 10*3/uL — ABNORMAL HIGH (ref 4.0–10.5)
nRBC: 0 % (ref 0.0–0.2)

## 2018-12-12 LAB — TYPE AND SCREEN
ABO/RH(D): A NEG
Antibody Screen: NEGATIVE

## 2018-12-12 LAB — COMPREHENSIVE METABOLIC PANEL
ALT: 50 U/L — ABNORMAL HIGH (ref 0–44)
AST: 57 U/L — ABNORMAL HIGH (ref 15–41)
Albumin: 2.7 g/dL — ABNORMAL LOW (ref 3.5–5.0)
Alkaline Phosphatase: 37 U/L — ABNORMAL LOW (ref 38–126)
Anion gap: 9 (ref 5–15)
BUN: 6 mg/dL (ref 6–20)
CO2: 21 mmol/L — ABNORMAL LOW (ref 22–32)
Calcium: 6.9 mg/dL — ABNORMAL LOW (ref 8.9–10.3)
Chloride: 105 mmol/L (ref 98–111)
Creatinine, Ser: 0.48 mg/dL (ref 0.44–1.00)
GFR calc Af Amer: >60 mL/min (ref >60)
GFR calc non Af Amer: >60 mL/min (ref >60)
Glucose, Bld: 125 mg/dL — ABNORMAL HIGH (ref 70–99)
Potassium: 3.5 mmol/L (ref 3.5–5.1)
Sodium: 135 mmol/L (ref 135–145)
Total Bilirubin: 0.5 mg/dL (ref 0.3–1.2)
Total Protein: 5.9 g/dL — ABNORMAL LOW (ref 6.5–8.1)

## 2018-12-12 LAB — RH IG WORKUP (INCLUDES ABO/RH)
ABO/RH(D): A NEG
Gestational Age(Wks): 23
Unit division: 0

## 2018-12-12 MED ORDER — CALCIUM CARBONATE ANTACID 500 MG PO CHEW
2.0000 | CHEWABLE_TABLET | Freq: Two times a day (BID) | ORAL | Status: DC
  Administered 2018-12-12 – 2019-01-11 (×59): 400 mg via ORAL
  Filled 2018-12-12 (×60): qty 2

## 2018-12-12 NOTE — Plan of Care (Signed)
  Problem: Education: Goal: Knowledge of disease or condition will improve Outcome: Progressing   

## 2018-12-12 NOTE — Progress Notes (Signed)
[place] PROGRESS NOTE  Maria Reed is a 38 y.o. G3P0020 at [redacted]w[redacted]d who is admitted for PROM.  Estimated Date of Delivery: 04/07/19 Fetal presentation is cephalic.  Length of Stay:  3 Days. Admitted 12/09/2018  Subjective:  Patient reports active fetal movement.  She denies uterine contractions, denies bleeding, reports small amount of leaking.  Vitals:  Blood pressure 101/62, pulse 89, temperature 98.4 F (36.9 C), temperature source Oral, resp. rate 16, height 5\' 2"  (1.575 m), weight 48.4 kg, last menstrual period 07/01/2018, SpO2 100 %. Physical Examination: CONSTITUTIONAL: Well-developed, well-nourished female in no acute distress.  HENT:  Normocephalic, atraumatic, External right and left ear normal. Oropharynx is clear and moist EYES: Conjunctivae and EOM are normal. Pupils are equal, round, and reactive to light. No scleral icterus.  NECK: Normal range of motion, supple, no masses. SKIN: Skin is warm and dry. No rash noted. Not diaphoretic. No erythema. No pallor. [place]: Alert and oriented to person, place, and time. Normal reflexes, muscle tone coordination. No cranial nerve deficit noted. PSYCHIATRIC: Normal mood and affect. Normal behavior. Normal judgment and thought content. CARDIOVASCULAR: Normal heart rate noted RESPIRATORY: Effort normal, no problems with respiration noted MUSCULOSKELETAL: Normal range of motion. No edema and no tenderness. ABDOMEN: Soft, nontender, nondistended, gravid. CERVIX: deferred  Fetal monitoring: FHR: 140 bpm, Variability: moderate, Accelerations: Present, Decelerations: Absent  Uterine activity: no contractions per hour  Results for orders placed or performed during the hospital encounter of 12/09/18 (from the past 48 hour(s))  Magnesium     Status: Abnormal   Collection Time: 12/10/18  3:32 PM  Result Value Ref Range   Magnesium 6.4 (HH) 1.7 - 2.4 mg/dL    Comment: CRITICAL RESULT CALLED TO, READ BACK BY AND VERIFIED  WITHLyman Speller [id number] 12/10/2018 WBOND Performed at [facility], [place] [address] [place], [place] [address]   Comprehensive metabolic panel     Status: Abnormal   Collection Time: 12/10/18  3:32 PM  Result Value Ref Range   Sodium 135 135 - 145 mmol/L   Potassium 3.8 3.5 - 5.1 mmol/L   Chloride 105 98 - 111 mmol/L   CO2 21 (L) 22 - 32 mmol/L   Glucose, Bld 123 (H) 70 - 99 mg/dL   BUN <5 (L) 6 - 20 mg/dL   Creatinine, Ser 0.59 0.44 - 1.00 mg/dL   Calcium 6.4 (LL) 8.9 - 10.3 mg/dL    Comment: CRITICAL RESULT CALLED TO, READ BACK BY AND VERIFIED WITH:  J [facility]  [id number] 12/10/2018 WBOND    Total Protein 6.1 (L) 6.5 - 8.1 g/dL   Albumin 2.7 (L) 3.5 - 5.0 g/dL   AST 16 15 - 41 U/L   ALT 15 0 - 44 U/L   Alkaline Phosphatase 36 (L) 38 - 126 U/L   Total Bilirubin 0.2 (L) 0.3 - 1.2 mg/dL   GFR calc non Af Amer >60 >60 mL/min   GFR calc Af Amer >60 >60 mL/min   Anion gap 9 5 - 15    Comment: Performed at [facility], [place] [address], [place], [place] [address]  Kleihauer-Betke stain     Status: None   Collection Time: 12/11/18 10:07 AM  Result Value Ref Range   Fetal Cells % 0 %   Quantitation Fetal Hemoglobin 0 mL   # Vials RhIg 1     Comment: Performed at [facility], [place] [address], [place], [place] [address]  Rh IG workup (includes  ABO/Rh)     Status: None   Collection Time: 12/11/18 10:07 AM  Result Value Ref Range   Gestational Age(Wks) 23    ABO/RH(D)      A NEG Performed at [facility] [address], [place], [place] [address]    Unit Number [id number]    Blood Component Type RHIG    Unit division 00    Status of Unit DISCARDED    Transfusion Status OK TO TRANSFUSE   Type and screen [place]     Status: None (Preliminary result)   Collection Time: 12/12/18 10:42 AM  Result Value Ref Range   ABO/RH(D) PENDING    Antibody Screen PENDING    Sample Expiration      12/15/2018,2359 Performed at [facility],  [place] [address], [place], [place] [address]   Comprehensive metabolic panel     Status: Abnormal   Collection Time: 12/12/18 10:42 AM  Result Value Ref Range   Sodium 135 135 - 145 mmol/L   Potassium 3.5 3.5 - 5.1 mmol/L   Chloride 105 98 - 111 mmol/L   CO2 21 (L) 22 - 32 mmol/L   Glucose, Bld 125 (H) 70 - 99 mg/dL   BUN 6 6 - 20 mg/dL   Creatinine, Ser 0.48 0.44 - 1.00 mg/dL   Calcium 6.9 (L) 8.9 - 10.3 mg/dL   Total Protein 5.9 (L) 6.5 - 8.1 g/dL   Albumin 2.7 (L) 3.5 - 5.0 g/dL   AST 57 (H) 15 - 41 U/L   ALT 50 (H) 0 - 44 U/L   Alkaline Phosphatase 37 (L) 38 - 126 U/L   Total Bilirubin 0.5 0.3 - 1.2 mg/dL   GFR calc non Af Amer >60 >60 mL/min   GFR calc Af Amer >60 >60 mL/min   Anion gap 9 5 - 15    Comment: Performed at [facility], [place] [address], [place], [place] [address]  CBC     Status: Abnormal   Collection Time: 12/12/18 10:42 AM  Result Value Ref Range   WBC 11.6 (H) 4.0 - 10.5 K/uL   RBC 2.36 (L) 3.87 - 5.11 MIL/uL   Hemoglobin 8.0 (L) 12.0 - 15.0 g/dL   HCT 24.3 (L) 36.0 - 46.0 %   MCV 103.0 (H) 80.0 - 100.0 fL   MCH 33.9 26.0 - 34.0 pg   MCHC 32.9 30.0 - 36.0 g/dL   RDW 14.0 11.5 - 15.5 %   Platelets 282 150 - 400 K/uL   nRBC 0.0 0.0 - 0.2 %    Comment: Performed at [facility], [place] [address] [place], [place] [address]    I have reviewed the patient's current medications.  ASSESSMENT: Active Problems:   Rh negative status during pregnancy   AMA (advanced maternal age) primigravida 35+   Preterm premature rupture of membranes (PPROM) with unknown onset of labor   PLAN: Cont latency abx S/p BTMZ S/p Mag Daily NST S/p NICU consult In house management until delivery   Continue routine antenatal care.   Feliz Beam, M.D. Attending [facility] [employer] [facility] (Faculty Practice)  12/12/2018 12:03 PM

## 2018-12-13 DIAGNOSIS — O42912 Preterm premature rupture of membranes, unspecified as to length of time between rupture and onset of labor, second trimester: Secondary | ICD-10-CM | POA: Diagnosis not present

## 2018-12-13 DIAGNOSIS — Z3A23 23 weeks gestation of pregnancy: Secondary | ICD-10-CM | POA: Diagnosis not present

## 2018-12-13 NOTE — Progress Notes (Signed)
[place] PROGRESS NOTE  Maria Reed is a 38 y.o. G3P0020 at [redacted]w[redacted]d who is admitted for PROM.  Estimated Date of Delivery: 04/07/19 Fetal presentation is cephalic.  Length of Stay:  4 Days. Admitted 12/09/2018  Subjective:  Patient reports very active fetal movement.  She denies uterine contractions, denies bleeding. Reports small amount of leaking of fluid per vagina.  Vitals:  Blood pressure (!) 89/55, pulse 78, temperature 98.4 F (36.9 C), temperature source Oral, resp. rate 18, height 5\' 2"  (1.575 m), weight 48.4 kg, last menstrual period 07/01/2018, SpO2 98 %. Physical Examination: CONSTITUTIONAL: Well-developed, well-nourished female in no acute distress.  HENT:  Normocephalic, atraumatic, External right and left ear normal. Oropharynx is clear and moist EYES: Conjunctivae and EOM are normal. Pupils are equal, round, and reactive to light. No scleral icterus.  NECK: Normal range of motion, supple, no masses. SKIN: Skin is warm and dry. No rash noted. Not diaphoretic. No erythema. No pallor. [place]: Alert and oriented to person, place, and time. Normal reflexes, muscle tone coordination. No cranial nerve deficit noted. PSYCHIATRIC: Normal mood and affect. Normal behavior. Normal judgment and thought content. CARDIOVASCULAR: Normal heart rate noted RESPIRATORY: Effort normal, no problems with respiration noted MUSCULOSKELETAL: Normal range of motion. No edema and no tenderness. ABDOMEN: Soft, nontender, nondistended, gravid. CERVIX: deferred  Fetal monitoring: reactive yesterday, has not been monitored yet today  Results for orders placed or performed during the hospital encounter of 12/09/18 (from the past 48 hour(s))  Type and screen [place]     Status: None   Collection Time: 12/12/18 10:42 AM  Result Value Ref Range   ABO/RH(D) A NEG    Antibody Screen NEG    Sample Expiration      12/15/2018,2359 Performed at [place], [place] [address] [place], [place] [address]   Comprehensive metabolic panel     Status: Abnormal   Collection Time: 12/12/18 10:42 AM  Result Value Ref Range   Sodium 135 135 - 145 mmol/L   Potassium 3.5 3.5 - 5.1 mmol/L   Chloride 105 98 - 111 mmol/L   CO2 21 (L) 22 - 32 mmol/L   Glucose, Bld 125 (H) 70 - 99 mg/dL   BUN 6 6 - 20 mg/dL   Creatinine, Ser 0.48 0.44 - 1.00 mg/dL   Calcium 6.9 (L) 8.9 - 10.3 mg/dL   Total Protein 5.9 (L) 6.5 - 8.1 g/dL   Albumin 2.7 (L) 3.5 - 5.0 g/dL   AST 57 (H) 15 - 41 U/L   ALT 50 (H) 0 - 44 U/L   Alkaline Phosphatase 37 (L) 38 - 126 U/L   Total Bilirubin 0.5 0.3 - 1.2 mg/dL   GFR calc non Af Amer >60 >60 mL/min   GFR calc Af Amer >60 >60 mL/min   Anion gap 9 5 - 15    Comment: Performed at [facility], [place] [address], [place], [place] [address]  CBC     Status: Abnormal   Collection Time: 12/12/18 10:42 AM  Result Value Ref Range   WBC 11.6 (H) 4.0 - 10.5 K/uL   RBC 2.36 (L) 3.87 - 5.11 MIL/uL   Hemoglobin 8.0 (L) 12.0 - 15.0 g/dL   HCT 24.3 (L) 36.0 - 46.0 %   MCV 103.0 (H) 80.0 - 100.0 fL   MCH 33.9 26.0 - 34.0 pg   MCHC 32.9 30.0 - 36.0 g/dL   RDW 14.0 11.5 - 15.5 %  Platelets 282 150 - 400 K/uL   nRBC 0.0 0.0 - 0.2 %    Comment: Performed at [facility], [place] [address] [place], [place] [address]    I have reviewed the patient's current medications.  ASSESSMENT: Active Problems:   Rh negative status during pregnancy   AMA (advanced maternal age) primigravida 35+   Preterm premature rupture of membranes (PPROM) with unknown onset of labor   PLAN: Cont latency abx S/p BTMZ S/p Mag Daily NST S/p NICU consult In house management until delivery   Continue routine antenatal care.   Feliz Beam, M.D. [facility]ending [facility] [employer] (Faculty Practice)  12/13/2018 10:47 AM

## 2018-12-14 DIAGNOSIS — Z3A23 23 weeks gestation of pregnancy: Secondary | ICD-10-CM | POA: Diagnosis not present

## 2018-12-14 DIAGNOSIS — O42912 Preterm premature rupture of membranes, unspecified as to length of time between rupture and onset of labor, second trimester: Secondary | ICD-10-CM | POA: Diagnosis not present

## 2018-12-14 NOTE — Progress Notes (Signed)
[place] PROGRESS NOTE  Maria Reed is a 38 y.o. G3P0020 at [redacted]w[redacted]d who is admitted for PROM.  Estimated Date of Delivery: 04/07/19 Fetal presentation is cephalic.  Length of Stay:  5 Days. Admitted 12/09/2018  Subjective:  Patient reports normal fetal movement.  She denies uterine contractions, denies bleeding and leaking of fluid per vagina.  Vitals:  Blood pressure 95/61, pulse 79, temperature 98 F (36.7 C), temperature source Oral, resp. rate 16, height 5\' 2"  (1.575 m), weight 48.4 kg, last menstrual period 07/01/2018, SpO2 99 %. Physical Examination: CONSTITUTIONAL: Well-developed, well-nourished female in no acute distress.  HENT:  Normocephalic, atraumatic, External right and left ear normal. Oropharynx is clear and moist EYES: Conjunctivae and EOM are normal. Pupils are equal, round, and reactive to light. No scleral icterus.  NECK: Normal range of motion, supple, no masses. SKIN: Skin is warm and dry. No rash noted. Not diaphoretic. No erythema. No pallor. [place]: Alert and oriented to person, place, and time. Normal reflexes, muscle tone coordination. No cranial nerve deficit noted. PSYCHIATRIC: Normal mood and affect. Normal behavior. Normal judgment and thought content. CARDIOVASCULAR: Normal heart rate noted RESPIRATORY: Effort normal, no problems with respiration noted MUSCULOSKELETAL: Normal range of motion. No edema and no tenderness. ABDOMEN: Soft, nontender, nondistended, gravid. CERVIX: deferred  From last pm Fetal monitoring: FHR: 150 bpm, Variability: moderate, Accelerations: Present, Decelerations: Absent  Uterine activity: no contractions per hour  Results for orders placed or performed during the hospital encounter of 12/09/18 (from the past 48 hour(s))  Type and screen [place]     Status: None   Collection Time: 12/12/18 10:42 AM  Result Value Ref Range   ABO/RH(D) A NEG    Antibody Screen NEG    Sample  Expiration      12/15/2018,2359 Performed at [facility] [address], [place], [place] [address]   Comprehensive metabolic panel     Status: Abnormal   Collection Time: 12/12/18 10:42 AM  Result Value Ref Range   Sodium 135 135 - 145 mmol/L   Potassium 3.5 3.5 - 5.1 mmol/L   Chloride 105 98 - 111 mmol/L   CO2 21 (L) 22 - 32 mmol/L   Glucose, Bld 125 (H) 70 - 99 mg/dL   BUN 6 6 - 20 mg/dL   Creatinine, Ser 0.48 0.44 - 1.00 mg/dL   Calcium 6.9 (L) 8.9 - 10.3 mg/dL   Total Protein 5.9 (L) 6.5 - 8.1 g/dL   Albumin 2.7 (L) 3.5 - 5.0 g/dL   AST 57 (H) 15 - 41 U/L   ALT 50 (H) 0 - 44 U/L   Alkaline Phosphatase 37 (L) 38 - 126 U/L   Total Bilirubin 0.5 0.3 - 1.2 mg/dL   GFR calc non Af Amer >60 >60 mL/min   GFR calc Af Amer >60 >60 mL/min   Anion gap 9 5 - 15    Comment: Performed at [facility], [place] [address] [place], [place] [address]  CBC     Status: Abnormal   Collection Time: 12/12/18 10:42 AM  Result Value Ref Range   WBC 11.6 (H) 4.0 - 10.5 K/uL   RBC 2.36 (L) 3.87 - 5.11 MIL/uL   Hemoglobin 8.0 (L) 12.0 - 15.0 g/dL   HCT 24.3 (L) 36.0 - 46.0 %   MCV 103.0 (H) 80.0 - 100.0 fL   MCH 33.9 26.0 - 34.0 pg   MCHC 32.9 30.0 - 36.0 g/dL   RDW  14.0 11.5 - 15.5 %   Platelets 282 150 - 400 K/uL   nRBC 0.0 0.0 - 0.2 %    Comment: Performed at [facility], [place] [address], [place], [place] [address]    I have reviewed the patient's current medications.  ASSESSMENT: Active Problems:   Rh negative status during pregnancy   AMA (advanced maternal age) primigravida 35+   Preterm premature rupture of membranes (PPROM) with unknown onset of labor   PLAN: Cont latency abx S/p BTMZ S/p Mag Daily NST S/p NICU consult In house management until delivery   Continue routine antenatal care.   Feliz Beam, M.D. Attending [facility] [employer] (Faculty Practice)  12/14/2018 10:40 AM

## 2018-12-15 DIAGNOSIS — O42912 Preterm premature rupture of membranes, unspecified as to length of time between rupture and onset of labor, second trimester: Secondary | ICD-10-CM | POA: Diagnosis not present

## 2018-12-15 DIAGNOSIS — Z3A23 23 weeks gestation of pregnancy: Secondary | ICD-10-CM | POA: Diagnosis not present

## 2018-12-15 LAB — CBC
HCT: 28.4 % — ABNORMAL LOW (ref 36.0–46.0)
Hemoglobin: 9.8 g/dL — ABNORMAL LOW (ref 12.0–15.0)
MCH: 34 pg (ref 26.0–34.0)
MCHC: 34.5 g/dL (ref 30.0–36.0)
MCV: 98.6 fL (ref 80.0–100.0)
Platelets: 330 10*3/uL (ref 150–400)
RBC: 2.88 MIL/uL — ABNORMAL LOW (ref 3.87–5.11)
RDW: 13.2 % (ref 11.5–15.5)
WBC: 9.5 10*3/uL (ref 4.0–10.5)
nRBC: 0 % (ref 0.0–0.2)

## 2018-12-15 LAB — TYPE AND SCREEN
ABO/RH(D): A NEG
Antibody Screen: NEGATIVE

## 2018-12-15 NOTE — Progress Notes (Signed)
Patient ID: Maria Reed, female   DOB: 1980/06/13, 38 y.o.   MRN: [id number] [place] COMPREHENSIVE PROGRESS NOTE  Maria Reed is a 38 y.o. G3P0020 at [redacted]w[redacted]d  who is admitted for PROM.   Fetal presentation is cephalic. U/S 12/09/18 Length of Stay:  6  Days  Subjective: No complaints this morning.  Patient reports good fetal movement.  She reports no uterine contractions or vaginal bleeding. Still LOF at times Tolerating diet  Vitals:  Blood pressure 98/61, pulse 73, temperature 98 F (36.7 C), resp. rate 17, height 5\' 2"  (1.575 m), weight 48.4 kg, last menstrual period 07/01/2018, SpO2 96 %.   Physical Examination: Lungs clear Heart RRR Abd soft + BS gravid non tedner Ext non tender  Fetal Monitoring:  140-150's, + accels, no decels or ut ctx  Labs:  No results found for this or any previous visit (from the past 24 hour(s)).  Imaging Studies:    NA   Medications:  Scheduled . amoxicillin  500 mg Oral TID  . aspirin  81 mg Oral Daily  . calcium carbonate  2 tablet Oral BID  . ferrous gluconate  324 mg Oral Q breakfast  . prenatal multivitamin  1 tablet Oral Q1200  . rho (d) immune globulin  300 mcg Intravenous Once  . sodium chloride flush  3 mL Intravenous Q12H   I have reviewed the patient's current medications.  ASSESSMENT: IUP 23 6/7 weeks PROM AMA, LR NIPS and negative AFP Rh negative   PLAN: Stable. No s/sx of infection or labor. S/P Magnesium and BMZ. Continue with latency abx. Delivery for maternal/fetal indications or 34 weeks.  Continue routine antenatal care.   Chancy Milroy 12/15/2018,7:28 AM

## 2018-12-16 DIAGNOSIS — O42912 Preterm premature rupture of membranes, unspecified as to length of time between rupture and onset of labor, second trimester: Secondary | ICD-10-CM

## 2018-12-16 DIAGNOSIS — Z3A23 23 weeks gestation of pregnancy: Secondary | ICD-10-CM

## 2018-12-16 NOTE — Progress Notes (Signed)
At 0305 Pt had complaints of LLQ pain. RN assessed abdomen, no tenderness or guarding. Temperature was 98.7*F. Applied fetal monitors and monitored from 0312 until 0356AM. At 0356AM monitors were discontinued. Pt no longer had complaints of pain. Will continue to monitor.

## 2018-12-16 NOTE — Progress Notes (Signed)
Patient ID: Maria Reed, female   DOB: 11-21-1980, 38 y.o.   MRN: [id number] d         Show:Clear all [x] Manual[x] Template[] Copied  Added by: [x] Chancy Milroy, MD  [] Hover for details Patient ID: Maria Reed, female   DOB: [birth year], 38 y.o.   MRN: [id number] [place] COMPREHENSIVE PROGRESS NOTE  Maria Reed is a 38 y.o. G3P0020 at [redacted]w[redacted]d  who is admitted for PROM.   Fetal presentation is cephalic. U/S 12/09/18 Length of Stay:  7 Days  Subjective: No complaints this morning.  Patient reports good fetal movement.  She reports no uterine contractions or vaginal bleeding. Still LOF at times Tolerating diet She is not having any issues with constipation.  Vitals:  Blood pressure 98/61, pulse 73, temperature 98 F (36.7 C), resp. rate 17, height 5\' 2"  (1.575 m), weight 48.4 kg, last menstrual period 07/01/2018, SpO2 96 %.   Physical Examination: Lungs clear     Heart RRR Abd soft + BS gravid non tedner Ext non tender  Fetal Monitoring:  140-150's, + accels, no decels or ut ctx  Labs:  No results found for this or any previous visit (from the past 24 hour(s)).  Imaging Studies:    NA   Medications:  Scheduled . amoxicillin  500 mg Oral TID  . aspirin  81 mg Oral Daily  . calcium carbonate  2 tablet Oral BID  . ferrous gluconate  324 mg Oral Q breakfast  . prenatal multivitamin  1 tablet Oral Q1200  . rho (d) immune globulin  300 mcg Intravenous Once  . sodium chloride flush  3 mL Intravenous Q12H   I have reviewed the patient's current medications.  ASSESSMENT: IUP 24 weeks PROM AMA, LR NIPS and negative AFP Rh negative   PLAN: Stable. No s/sx of infection or labor. S/P Magnesium and BMZ. Continue with latency abx. Delivery for maternal/fetal indications or 34 weeks.  Continue routine antenatal care.

## 2018-12-17 DIAGNOSIS — Z3A23 23 weeks gestation of pregnancy: Secondary | ICD-10-CM | POA: Diagnosis not present

## 2018-12-17 DIAGNOSIS — O42912 Preterm premature rupture of membranes, unspecified as to length of time between rupture and onset of labor, second trimester: Secondary | ICD-10-CM | POA: Diagnosis not present

## 2018-12-17 NOTE — Progress Notes (Signed)
Initial Nutrition Assessment  DOCUMENTATION CODES:  Not applicable INTERVENTION:  Regular diet Snacks TID and double protein portions upon pt request NUTRITION DIAGNOSIS:  Increased nutrient needs related to (pregnancy and fetal growth requirements) as evidenced by (24 weeks IUP). GOAL:  Patient will meet greater than or equal to 90% of their needs, Weight gain MONITOR:  Weight trends REASON FOR ASSESSMENT:  Antenatal   ASSESSMENT:  24 1/7 weeks, adm due to PROM. Wt at initial PNV 45.8 kg, BMI 18.5. 6 lb weight gain to date.  PNV and additional iron supps  Delivery planned for 34 weeks  Diet Order:   Diet Order            Diet regular Room service appropriate? Yes; Fluid consistency: Thin  Diet effective now             EDUCATION NEEDS:  No education needs have been identified at this time  Skin:  Skin Assessment: Reviewed RN Assessment Height:   Ht Readings from Last 1 Encounters:  12/09/18 5\' 2"  (1.575 m)   Weight:   Wt Readings from Last 1 Encounters:  12/09/18 48.4 kg   Ideal Body Weight:   110 lbs BMI:  Body mass index is 19.52 kg/m.  Estimated Nutritional Needs:   Kcal:  1500-1700  Protein:  70-80 g  Fluid:  1.8 L    Weyman Rodney M.Fredderick Severance LDN Neonatal Nutrition Support Specialist/RD III Pager [phone]      Phone [phone]

## 2018-12-17 NOTE — Progress Notes (Signed)
Patient ID: Maria Reed, female   DOB: [birth year], 38 y.o.   MRN: [id number] [place] COMPREHENSIVE PROGRESS NOTE  Maria Reed is a 38 y.o. G3P0020 at [redacted]w[redacted]d  who is admitted for PROM.   Fetal presentation is cephalic. U/S 12/09/2018 Length of Stay:  8  Days  Subjective: No complaints this morning. Tolerating diet. Patient reports good fetal movement.  She reports no uterine contractions, no bleeding. Still occleaking of fluid per vagina.  Vitals:  Blood pressure (!) 85/53, pulse 93, temperature 97.9 F (36.6 C), temperature source Oral, resp. rate 18, height 5\' 2"  (1.575 m), weight 48.4 kg, last menstrual period 07/01/2018, SpO2 98 %.   Physical Examination: Lungs clear Heart RRR Abd soft + BS gravid non tender Ext non tender   Fetal Monitoring:  140-150's, + acels, no decels, no ut ctx  Labs:  No results found for this or any previous visit (from the past 24 hour(s)).  Imaging Studies:    NA   Medications:  Scheduled . aspirin  81 mg Oral Daily  . calcium carbonate  2 tablet Oral BID  . ferrous gluconate  324 mg Oral Q breakfast  . prenatal multivitamin  1 tablet Oral Q1200  . rho (d) immune globulin  300 mcg Intravenous Once  . sodium chloride flush  3 mL Intravenous Q12H   I have reviewed the patient's current medications.  ASSESSMENT: IUP 24 1/7 weeks PROM AMS. LR NIPS and negative AFP Rh negative  PLAN: Stable. No S/Sx of infection or labor. S/P magnesium, BMZ and antibiotics. delivery at 34 weeks or for maternal/fetal indications Continue routine antenatal care.   Chancy Milroy 12/17/2018,10:11 AM

## 2018-12-18 DIAGNOSIS — O42912 Preterm premature rupture of membranes, unspecified as to length of time between rupture and onset of labor, second trimester: Secondary | ICD-10-CM | POA: Diagnosis not present

## 2018-12-18 DIAGNOSIS — Z3A23 23 weeks gestation of pregnancy: Secondary | ICD-10-CM | POA: Diagnosis not present

## 2018-12-18 LAB — CBC
HCT: 27.5 % — ABNORMAL LOW (ref 36.0–46.0)
Hemoglobin: 9.7 g/dL — ABNORMAL LOW (ref 12.0–15.0)
MCH: 34.8 pg — ABNORMAL HIGH (ref 26.0–34.0)
MCHC: 35.3 g/dL (ref 30.0–36.0)
MCV: 98.6 fL (ref 80.0–100.0)
Platelets: 292 10*3/uL (ref 150–400)
RBC: 2.79 MIL/uL — ABNORMAL LOW (ref 3.87–5.11)
RDW: 13.4 % (ref 11.5–15.5)
WBC: 8 10*3/uL (ref 4.0–10.5)
nRBC: 0 % (ref 0.0–0.2)

## 2018-12-18 LAB — TYPE AND SCREEN
ABO/RH(D): A NEG
Antibody Screen: NEGATIVE

## 2018-12-18 NOTE — Progress Notes (Signed)
Patient ID: Maria Reed, female   DOB: 09-12-1980, 38 y.o.   MRN: [id number] [place] COMPREHENSIVE PROGRESS NOTE  Maria Reed is a 38 y.o. G3P0020 at [redacted]w[redacted]d  who is admitted for PROM.   Fetal presentation is cephalic. U/S 12/09/18 Length of Stay:  9  Days  Subjective: No complaints this morning.  Patient reports good fetal movement.  She reports no uterine contractions or  Bleeding. Still some loss of fluid per vagina at times  Vitals:  Blood pressure (!) 80/68, pulse 93, temperature 98.2 F (36.8 C), temperature source Oral, resp. rate 18, height 5\' 2"  (1.575 m), weight 48.4 kg, last menstrual period 07/01/2018, SpO2 99 %.   Physical Examination: Lungs clear Heart RRR Abd soft + BS gravid non tender Ext non tender   Fetal Monitoring:  140's, + accels , no decels, no ut ctx  Labs:  No results found for this or any previous visit (from the past 24 hour(s)).  Imaging Studies:    NA   Medications:  Scheduled . aspirin  81 mg Oral Daily  . calcium carbonate  2 tablet Oral BID  . ferrous gluconate  324 mg Oral Q breakfast  . prenatal multivitamin  1 tablet Oral Q1200  . rho (d) immune globulin  300 mcg Intravenous Once  . sodium chloride flush  3 mL Intravenous Q12H   I have reviewed the patient's current medications.  ASSESSMENT: IUP 24 2/7 weeks PROM AMA, LR NIPS and negative AFP Rh negative  PLAN: Stable. No S/Sx of infection or labor. S/P BMZ and antibiotics. Delivery at 34 weeks or for maternal/fetal indications Continue routine antenatal care.   Chancy Milroy 12/18/2018,9:46 AM

## 2018-12-19 DIAGNOSIS — Z3A24 24 weeks gestation of pregnancy: Secondary | ICD-10-CM | POA: Diagnosis not present

## 2018-12-19 DIAGNOSIS — O42912 Preterm premature rupture of membranes, unspecified as to length of time between rupture and onset of labor, second trimester: Secondary | ICD-10-CM | POA: Diagnosis not present

## 2018-12-19 NOTE — Progress Notes (Signed)
Patient ID: Maria Reed, female   DOB: 05/30/1980, 38 y.o.   MRN: [id number] [place] COMPREHENSIVE PROGRESS NOTE  Maria Reed is a 38 y.o. G3P0020 at [redacted]w[redacted]d  who is admitted for PROM.   Fetal presentation is cephalic. U/S 12/09/18 Length of Stay:  10  Days  Subjective: No new complaints today Patient reports good fetal movement.  She reports no uterine contractions or no bleeding.Still some  loss of fluid per vagina.  Vitals:  Blood pressure 92/60, pulse 98, temperature 98.1 F (36.7 C), temperature source Oral, resp. rate 18, height 5\' 2"  (1.575 m), weight 48.4 kg, last menstrual period 07/01/2018, SpO2 100 %.   Physical Examination: Lungs clear Heart RRR Abd soft + BS gravid non tender Ext non tender  Fetal Monitoring:  140's, + accels, no decels, no ut ctx  Labs:  No results found for this or any previous visit (from the past 24 hour(s)).  Imaging Studies:    NA   Medications:  Scheduled . aspirin  81 mg Oral Daily  . calcium carbonate  2 tablet Oral BID  . ferrous gluconate  324 mg Oral Q breakfast  . prenatal multivitamin  1 tablet Oral Q1200  . rho (d) immune globulin  300 mcg Intravenous Once  . sodium chloride flush  3 mL Intravenous Q12H   I have reviewed the patient's current medications.  ASSESSMENT: IUP 24 3/7 weeks PROM AMA, LR NIPS and negative AFP Rh negative  PLAN: Stable. No S/Sx of infection or labor. S/P magnesium, BMZ and antibiotics. Delivery at 34 weeks or for maternal/fetal indications Continue routine antenatal care.   Chancy Milroy 12/19/2018,11:57 AM

## 2018-12-20 DIAGNOSIS — Z3A24 24 weeks gestation of pregnancy: Secondary | ICD-10-CM | POA: Diagnosis not present

## 2018-12-20 DIAGNOSIS — O42912 Preterm premature rupture of membranes, unspecified as to length of time between rupture and onset of labor, second trimester: Secondary | ICD-10-CM | POA: Diagnosis not present

## 2018-12-20 NOTE — Progress Notes (Signed)
Patient ID: Maria Reed, female   DOB: 25-Jul-1980, 38 y.o.   MRN: [id number] [place] COMPREHENSIVE PROGRESS NOTE  Maria Reed is a 38 y.o. G3P0020 at [redacted]w[redacted]d  who is admitted for PROM.   Fetal presentation is cephalic. U/S 12/18/18 Length of Stay:  11  Days  Subjective: No new complaints this morning. Tolerating diet. Patient reports good fetal movement.  She reports no uterine contractions orno bleeding. Still occ loss of fluid per vagina.  Vitals:  Blood pressure (!) 95/52, pulse 88, temperature 98 F (36.7 C), temperature source Oral, resp. rate 18, height 5\' 2"  (1.575 m), weight 48.4 kg, last menstrual period 07/01/2018, SpO2 99 %.   Physical Examination: Lungs clear Heart RRR Abd soft + BS gravid non tender Ext non tender  Fetal Monitoring:  150's, + accels, no decels, no ut ctx  Labs:  No results found for this or any previous visit (from the past 24 hour(s)).  Imaging Studies:    NA   Medications:  Scheduled . aspirin  81 mg Oral Daily  . calcium carbonate  2 tablet Oral BID  . ferrous gluconate  324 mg Oral Q breakfast  . prenatal multivitamin  1 tablet Oral Q1200  . rho (d) immune globulin  300 mcg Intravenous Once  . sodium chloride flush  3 mL Intravenous Q12H   I have reviewed the patient's current medications.  ASSESSMENT: IUP  37 3/7 weeks PROM AMA, LR NIPS, negative AFP Rh negative  PLAN: Stable. S/P magnesium, antibiotics and BMZ. No S/Sx of infection or labor. Delivery at 34 weeks or for maternal/fetal indications Continue routine antenatal care.   Chancy Milroy 37/15/2020,10:27 AM

## 2018-12-21 DIAGNOSIS — Z3A24 24 weeks gestation of pregnancy: Secondary | ICD-10-CM | POA: Diagnosis not present

## 2018-12-21 DIAGNOSIS — O42912 Preterm premature rupture of membranes, unspecified as to length of time between rupture and onset of labor, second trimester: Secondary | ICD-10-CM | POA: Diagnosis not present

## 2018-12-21 LAB — CBC
HCT: 29.1 % — ABNORMAL LOW (ref 36.0–46.0)
Hemoglobin: 9.9 g/dL — ABNORMAL LOW (ref 12.0–15.0)
MCH: 34.4 pg — ABNORMAL HIGH (ref 26.0–34.0)
MCHC: 34 g/dL (ref 30.0–36.0)
MCV: 101 fL — ABNORMAL HIGH (ref 80.0–100.0)
Platelets: 279 10*3/uL (ref 150–400)
RBC: 2.88 MIL/uL — ABNORMAL LOW (ref 3.87–5.11)
RDW: 13.4 % (ref 11.5–15.5)
WBC: 8.4 10*3/uL (ref 4.0–10.5)
nRBC: 0 % (ref 0.0–0.2)

## 2018-12-21 LAB — TYPE AND SCREEN
ABO/RH(D): A NEG
Antibody Screen: NEGATIVE

## 2018-12-21 NOTE — Progress Notes (Signed)
Patient ID: Maria Reed, female   DOB: 24-Oct-1980, 38 y.o.   MRN: [id number] [place] COMPREHENSIVE PROGRESS NOTE  Maria Reed is a 38 y.o. G3P0020 at [redacted]w[redacted]d  who is admitted for PROM.   Fetal presentation is cephalic. U/S 12/09/18  Length of Stay:  12  Days  Subjective: No complaints this morning. Tolerating diet Patient reports good fetal movement.  She reports no uterine contractions, no bleeding. Still occ  loss of fluid per vagina.  Vitals:  Blood pressure (!) 90/53, pulse 77, temperature 98.2 F (36.8 C), temperature source Oral, resp. rate 18, height 5\' 2"  (1.575 m), weight 48.4 kg, last menstrual period 07/01/2018, SpO2 99 %.   Physical Examination: Lungs clear Heart RRR Abd soft + BS gravid non tender Ext non tender  Fetal Monitoring:  130-150's, + accels, no decels, no ut ctx  Labs:  No results found for this or any previous visit (from the past 24 hour(s)).  Imaging Studies:    NA   Medications:  Scheduled . aspirin  81 mg Oral Daily  . calcium carbonate  2 tablet Oral BID  . ferrous gluconate  324 mg Oral Q breakfast  . prenatal multivitamin  1 tablet Oral Q1200  . rho (d) immune globulin  300 mcg Intravenous Once  . sodium chloride flush  3 mL Intravenous Q12H   I have reviewed the patient's current medications.  ASSESSMENT: IUP 37 5/7 weeks PROM AMA, LR NIPS and negative AFP Rh negative  PLAN: Stable. No S/Sx of infection. S/P magnesium, antibiotics and BMZ. Delivery at 34 weeks or for maternal/fetal indications.  Continue routine antenatal care.   Chancy Milroy 12/21/2018,11:23 AM

## 2018-12-22 DIAGNOSIS — O09512 Supervision of elderly primigravida, second trimester: Secondary | ICD-10-CM | POA: Diagnosis not present

## 2018-12-22 DIAGNOSIS — O42912 Preterm premature rupture of membranes, unspecified as to length of time between rupture and onset of labor, second trimester: Secondary | ICD-10-CM | POA: Diagnosis not present

## 2018-12-22 DIAGNOSIS — O360920 Maternal care for other rhesus isoimmunization, second trimester, not applicable or unspecified: Secondary | ICD-10-CM | POA: Diagnosis not present

## 2018-12-22 DIAGNOSIS — O26892 Other specified pregnancy related conditions, second trimester: Secondary | ICD-10-CM | POA: Diagnosis not present

## 2018-12-22 NOTE — Progress Notes (Signed)
Patient ID: Maria Reed, female   DOB: 17-Oct-1980, 38 y.o.   MRN: [id number] [place]) NOTE  Chandler Mang is a 38 y.o. [id number] with Estimated Date of Delivery: 04/07/19   By   [redacted]w[redacted]d  who is admitted for PROM.    Fetal presentation is cephalic. Length of Stay:  13  Days  Date of admission:12/09/2018  Subjective: Some pressure  Patient reports the fetal movement as active. Patient reports uterine contraction  activity as none. Patient reports  vaginal bleeding as none. Patient describes fluid per vagina as None.  Vitals:  Blood pressure (!) 89/52, pulse 82, temperature 98.2 F (36.8 C), temperature source Oral, resp. rate 18, height 5\' 2"  (1.575 m), weight 48.4 kg, last menstrual period 07/01/2018, SpO2 99 %. Vitals:   12/21/18 2314 12/22/18 0400 12/22/18 0746 12/22/18 0801  BP: (!) 91/52 (!) 95/52 (!) 89/52   Pulse: 87 92 82   Resp: 18 17  18   Temp: 98.1 F (36.7 C) 98.1 F (36.7 C)  98.2 F (36.8 C)  TempSrc: Oral Oral  Oral  SpO2: 100% 98% 98% 99%  Weight:      Height:       Physical Examination:  General appearance - alert, well appearing, and in no distress Abdomen - soft, nontender, nondistended, no masses or organomegaly Fundal Height:  size equals dates Pelvic Exam:  examination not indicated Cervical Exam: Not evaluated.  Extremities: extremities normal, atraumatic, no cyanosis or edema with DTRs 2+ bilaterally Membranes:, ruptured  Fetal Monitoring: no decels appropriate for gestational Labs:  Results for orders placed or performed during the hospital encounter of 12/09/18 (from the past 24 hour(s))  CBC   Collection Time: 12/21/18 11:12 AM  Result Value Ref Range   WBC 8.4 4.0 - 10.5 K/uL   RBC 2.88 (L) 3.87 - 5.11 MIL/uL   Hemoglobin 9.9 (L) 12.0 - 15.0 g/dL   HCT 29.1 (L) 36.0 - 46.0 %   MCV 101.0 (H) 80.0 - 100.0 fL   MCH 34.4 (H) 26.0 - 34.0 pg   MCHC 34.0 30.0 - 36.0 g/dL   RDW 13.4 11.5 - 15.5 %   Platelets 279 150  - 400 K/uL   nRBC 0.0 0.0 - 0.2 %  Type and screen [place]   Collection Time: 12/21/18 11:12 AM  Result Value Ref Range   ABO/RH(D) A NEG    Antibody Screen NEG    Sample Expiration      12/24/2018,2359 Performed at [facility], [place] [address] [place], [place] [address]     Imaging Studies:      Medications:  Scheduled . aspirin  81 mg Oral Daily  . calcium carbonate  2 tablet Oral BID  . ferrous gluconate  324 mg Oral Q breakfast  . prenatal multivitamin  1 tablet Oral Q1200  . rho (d) immune globulin  300 mcg Intravenous Once  . sodium chloride flush  3 mL Intravenous Q12H   I have reviewed the patient's current medications.  ASSESSMENT: [id number] [redacted]w[redacted]d Estimated Date of Delivery: 04/07/19  Patient Active Problem List   Diagnosis Date Noted  . Preterm premature rupture of membranes (PPROM) with unknown onset of labor 12/09/2018  . Supervision of normal pregnancy, antepartum 09/17/2018  . Rh negative status during pregnancy 09/17/2018  . AMA (advanced maternal age) primigravida 35+ 09/17/2018    PLAN: >continue current care:In house observation with twice daily monitoring >deliver if labors or clinically indicated otherwise >S/P BMZ x  2 latency antibiotics and NICU consult  Luther H Eure 12/22/2018,8:35 AM

## 2018-12-22 NOTE — Progress Notes (Signed)
Pt called RN to and stated "my stomach hurts, I have a sharp pain in my lower stomach." Pt states she is still leaking a little bit of clear fluid. Pt denies RUQ pain. Fetal heart monitors were applied. Will continue to monitor.

## 2018-12-23 NOTE — Progress Notes (Signed)
Dr. Kennon Rounds notified of 4 minute prolong.  ORders received to continue monitoring.  Will continue to assess patient.

## 2018-12-23 NOTE — Progress Notes (Signed)
Patient ID: Maria Reed, female   DOB: October 12, 1980, 38 y.o.   MRN: [id number] Patient ID: Maria Reed, female   DOB: 1981/01/14, 38 y.o.   MRN: [id number] [place]) NOTE  Maria Reed is a 38 y.o. G3P0020 with Estimated Date of Delivery: 04/07/19   By [redacted]w[redacted]d  who is admitted for PROM.    Fetal presentation is cephalic. Length of Stay:  14  Days  Date of admission:12/09/2018  Subjective: Some pressure  Patient reports the fetal movement as active. Patient reports uterine contraction  activity as none. Patient reports  vaginal bleeding as none. Patient describes fluid per vagina as None.  Vitals:  Blood pressure (!) 84/47, pulse 88, temperature 97.7 F (36.5 C), temperature source Oral, resp. rate 17, height 5\' 2"  (1.575 m), weight 48.4 kg, last menstrual period 07/01/2018, SpO2 100 %. Vitals:   12/22/18 1553 12/22/18 1925 12/22/18 2220 12/23/18 0800  BP: (!) 89/52 (!) 89/49 (!) 87/53 (!) 84/47  Pulse: 89 (!) 101 97 88  Resp: 18 17 15 17   Temp: 98.1 F (36.7 C) 98.3 F (36.8 C) 98.1 F (36.7 C) 97.7 F (36.5 C)  TempSrc: Oral Oral Oral Oral  SpO2: 97% 97% 99% 100%  Weight:      Height:       Physical Examination:  General appearance - alert, well appearing, and in no distress Abdomen - soft, nontender, nondistended, no masses or organomegaly Fundal Height:  size equals dates Pelvic Exam:  examination not indicated Cervical Exam: Not evaluated.  Extremities: extremities normal, atraumatic, no cyanosis or edema with DTRs 2+ bilaterally Membranes:, ruptured  Fetal Monitoring: no decels appropriate for gestational Labs:  No results found for this or any previous visit (from the past 24 hour(s)).  Imaging Studies:      Medications:  Scheduled . aspirin  81 mg Oral Daily  . calcium carbonate  2 tablet Oral BID  . ferrous gluconate  324 mg Oral Q breakfast  . prenatal multivitamin  1 tablet Oral Q1200  . rho (d) immune globulin  300 mcg  Intravenous Once  . sodium chloride flush  3 mL Intravenous Q12H   I have reviewed the patient's current medications.  ASSESSMENT: [id number] [redacted]w[redacted]d Estimated Date of Delivery: 04/07/19  Patient Active Problem List   Diagnosis Date Noted  . Preterm premature rupture of membranes (PPROM) with unknown onset of labor 12/09/2018  . Supervision of normal pregnancy, antepartum 09/17/2018  . Rh negative status during pregnancy 09/17/2018  . AMA (advanced maternal age) primigravida 35+ 09/17/2018    PLAN: No substanitive change in care plan 12/23/2018 8:52 AM; >continue current care:In house observation with twice daily monitoring >deliver if labors or clinically indicated otherwise >S/P BMZ x 2 latency antibiotics and NICU consult  Eline Geng H Blakelynn Scheeler 12/23/2018,8:51 AM

## 2018-12-24 DIAGNOSIS — O42912 Preterm premature rupture of membranes, unspecified as to length of time between rupture and onset of labor, second trimester: Secondary | ICD-10-CM | POA: Diagnosis not present

## 2018-12-24 LAB — CBC
HCT: 25.9 % — ABNORMAL LOW (ref 36.0–46.0)
Hemoglobin: 8.9 g/dL — ABNORMAL LOW (ref 12.0–15.0)
MCH: 34.6 pg — ABNORMAL HIGH (ref 26.0–34.0)
MCHC: 34.4 g/dL (ref 30.0–36.0)
MCV: 100.8 fL — ABNORMAL HIGH (ref 80.0–100.0)
Platelets: 255 10*3/uL (ref 150–400)
RBC: 2.57 MIL/uL — ABNORMAL LOW (ref 3.87–5.11)
RDW: 13.4 % (ref 11.5–15.5)
WBC: 6.5 10*3/uL (ref 4.0–10.5)
nRBC: 0 % (ref 0.0–0.2)

## 2018-12-24 LAB — TYPE AND SCREEN
ABO/RH(D): A NEG
Antibody Screen: NEGATIVE

## 2018-12-24 NOTE — Progress Notes (Signed)
Patient ID: Maria Reed, female   DOB: 10-Mar-1980, 38 y.o.   MRN: [id number] [place]) NOTE  Kaleema Vitullo is a 38 y.o. G3P0020 with Estimated Date of Delivery: 04/07/19   By  early ultrasound [redacted]w[redacted]d  who is admitted for PROM.    Fetal presentation is cephalic. Length of Stay:  15  Days  Date of admission:12/09/2018  Subjective:  Patient reports the fetal movement as active. Patient reports uterine contraction  activity as none. Patient reports  vaginal bleeding as none. Patient describes fluid per vagina as Clear.  Vitals:  Blood pressure (!) 92/53, pulse 94, temperature 97.9 F (36.6 C), temperature source Oral, resp. rate 16, height 5\' 2"  (1.575 m), weight 48.4 kg, last menstrual period 07/01/2018, SpO2 100 %. Vitals:   12/23/18 1927 12/23/18 2306 12/24/18 0528 12/24/18 0837  BP: 93/62 (!) 89/52 (!) 81/49 (!) 92/53  Pulse: 97 87 85 94  Resp: 17 17 17 16   Temp: 98.3 F (36.8 C) 98.2 F (36.8 C) 98.4 F (36.9 C) 97.9 F (36.6 C)  TempSrc: Oral Oral Oral Oral  SpO2: 99% 99% 99% 100%  Weight:      Height:       Physical Examination:  General appearance - alert, well appearing, and in no distress Abdomen - soft, nontender, nondistended, no masses or organomegaly Fundal Height:  size equals dates Pelvic Exam:  examination not indicated Cervical Exam: Not evaluated.  Extremities: extremities normal, atraumatic, no cyanosis or edema with DTRs 2+ bilaterally Membranes:ruptured, clear fluid  Fetal Monitoring:  Baseline: 140s bpm, Variability: Fair (1-6 bpm), Accelerations: Non-reactive but appropriate for gestational age and Decelerations: Absent     Labs:  No results found for this or any previous visit (from the past 24 hour(s)).  Imaging Studies:      Medications:  Scheduled . aspirin  81 mg Oral Daily  . calcium carbonate  2 tablet Oral BID  . ferrous gluconate  324 mg Oral Q breakfast  . prenatal multivitamin  1 tablet Oral Q1200  .  rho (d) immune globulin  300 mcg Intravenous Once  . sodium chloride flush  3 mL Intravenous Q12H   I have reviewed the patient's current medications.  ASSESSMENT: [id number] [redacted]w[redacted]d Estimated Date of Delivery: 04/07/19  Patient Active Problem List   Diagnosis Date Noted  . Preterm premature rupture of membranes (PPROM) with unknown onset of labor 12/09/2018  . Supervision of normal pregnancy, antepartum 09/17/2018  . Rh negative status during pregnancy 09/17/2018  . AMA (advanced maternal age) primigravida 35+ 09/17/2018    PLAN: >steroids given >s/p latency antibiotics >NICU consultation done  Continue in house observation, deliver for S/S infection and or labor or fetal indications   H  12/24/2018,8:46 AM

## 2018-12-25 MED ORDER — LACTATED RINGERS IV BOLUS
500.0000 mL | Freq: Once | INTRAVENOUS | Status: AC
  Administered 2018-12-25: 500 mL via INTRAVENOUS

## 2018-12-25 MED ORDER — LACTATED RINGERS IV SOLN
INTRAVENOUS | Status: DC
  Administered 2018-12-25: 19:00:00 via INTRAVENOUS

## 2018-12-26 DIAGNOSIS — O360920 Maternal care for other rhesus isoimmunization, second trimester, not applicable or unspecified: Secondary | ICD-10-CM | POA: Diagnosis not present

## 2018-12-26 DIAGNOSIS — O09512 Supervision of elderly primigravida, second trimester: Secondary | ICD-10-CM | POA: Diagnosis not present

## 2018-12-26 DIAGNOSIS — O42912 Preterm premature rupture of membranes, unspecified as to length of time between rupture and onset of labor, second trimester: Secondary | ICD-10-CM | POA: Diagnosis not present

## 2018-12-26 DIAGNOSIS — Z3A25 25 weeks gestation of pregnancy: Secondary | ICD-10-CM

## 2018-12-26 DIAGNOSIS — O26892 Other specified pregnancy related conditions, second trimester: Secondary | ICD-10-CM | POA: Diagnosis not present

## 2018-12-26 MED ORDER — LACTATED RINGERS IV SOLN: INTRAVENOUS | Status: DC

## 2018-12-26 MED ORDER — LACTATED RINGERS IV SOLN
INTRAVENOUS | Status: DC
  Administered 2018-12-26 – 2019-01-02 (×15): via INTRAVENOUS

## 2018-12-26 MED ORDER — LACTATED RINGERS IV BOLUS
500.0000 mL | Freq: Once | INTRAVENOUS | Status: AC
  Administered 2018-12-26: 500 mL via INTRAVENOUS

## 2018-12-26 NOTE — Progress Notes (Signed)
Patient ID: Maria Reed, female   DOB: Sep 09, 1980, 38 y.o.   MRN: [id number] [place]) NOTE  Maria Reed is a 38 y.o. G3P0020 with Estimated Date of Delivery: 04/07/19   By  early ultrasound [redacted]w[redacted]d  who is admitted for PROM.   Fetal presentation is cephalic. Length of Stay:  17  Days  Date of admission:12/09/2018  Subjective: No complaints. Patient reports the fetal movement as active. Patient reports uterine contraction  activity as none. Patient reports  vaginal bleeding as none. Patient describes fluid per vagina as clear.  Vitals:  Blood pressure (!) 84/52, pulse 81, temperature 98.1 F (36.7 C), temperature source Oral, resp. rate 16, height 5\' 2"  (1.575 m), weight 48.4 kg, last menstrual period 07/01/2018, SpO2 99 %. Vitals:   12/25/18 1543 12/25/18 2006 12/26/18 0359 12/26/18 0750  BP: (!) 89/54 92/63 (!) 87/52 (!) 84/52  Pulse: 90 85 84 81  Resp: 16 17 16 16   Temp: 98 F (36.7 C) 98.2 F (36.8 C) 97.6 F (36.4 C) 98.1 F (36.7 C)  TempSrc: Oral Oral Oral Oral  SpO2: 96% 96% 96% 99%  Weight:      Height:       Physical Examination: General appearance - alert, well appearing, and in no distress Abdomen - soft, nontender, nondistended, no masses or organomegaly Fundal Height:  size equals dates Pelvic Exam:  examination not indicated Cervical Exam: Not evaluated.  Extremities: extremities normal, atraumatic, no cyanosis or edema with DTRs 2+ bilaterally Membranes:ruptured, clear fluid  Fetal Monitoring:  Baseline: 140s bpm, Variability: Moderate (6-25 bpm), Accelerations: Appropriate for gestational age and Decelerations: Absent     Labs:  No results found for this or any previous visit (from the past 24 hour(s)).  Imaging Studies:    [place] Mfm Ob Follow Up  Result Date: 12/10/2018 ----------------------------------------------------------------------  OBSTETRICS REPORT                       (Signed Final 12/10/2018 12:57 pm)  ---------------------------------------------------------------------- Patient Info  ID #:       [id number]                          D.O.B.:  [birth year] (38 yrs)  Name:       Maria Reed                  Visit Date: 12/09/2018 05:46 pm ---------------------------------------------------------------------- Performed By  Performed By:     Wilnette Kales        Ref. Address:      Faculty                    RDMS,RVT  Attending:        Sander Nephew      [facility]                                        [facility]  Referred By:      Mora Bellman MD ---------------------------------------------------------------------- Orders   #  Description  Code         Ordered By   1  [place] MFM OB FOLLOW UP                  [id number]     CHARLIE PICKENS  ----------------------------------------------------------------------   #  Order #                    Accession #                 Episode #   1  [id number]                  [id number]                  [id number]  ---------------------------------------------------------------------- Indications   Premature rupture of membranes - leaking       O42.90   fluid   Advanced maternal age multigravida 37,         O55.522   second trimester   [redacted] weeks gestation of pregnancy                Z3A.23  ---------------------------------------------------------------------- Vital Signs                                                 Height:        5'2" ---------------------------------------------------------------------- Fetal Evaluation  Num Of Fetuses:          1  Fetal Heart Rate(bpm):   132  Cardiac Activity:        Observed  Presentation:            Cephalic  Placenta:                Anterior  P. Cord Insertion:       Visualized, central  Amniotic Fluid  AFI FV:      Oligohydramnios ---------------------------------------------------------------------- Biometry  BPD:      57.5  mm     G. Age:  23w 4d          68  %    CI:        75.15   %    70 - 86                                                          FL/HC:       18.8  %    19.2 - 20.8  HC:      210.4  mm     G. Age:  23w 1d         40  %    HC/AC:       1.20       1.05 - 1.21  AC:      175.4  mm     G. Age:  22w 3d         25  %    FL/BPD:      68.7  %    71 - 87  FL:       39.5  mm     G. Age:  22w 5d  29  %    FL/AC:       22.5  %    20 - 24  Est. FW:     524   gm     1 lb 2 oz     27  % ---------------------------------------------------------------------- OB History  Gravidity:    3          SAB:   2 ---------------------------------------------------------------------- Gestational Age  LMP:           23w 0d        Date:  07/01/18                 EDD:   04/07/19  U/S Today:     23w 0d                                        EDD:   04/07/19  Best:          23w 0d     Det. By:  LMP  (07/01/18)          EDD:   04/07/19 ---------------------------------------------------------------------- Anatomy  Cranium:               Previously seen        Aortic Arch:            Previously seen  Cavum:                 Previously seen        Ductal Arch:            Previously seen  Ventricles:            Previously seen        Diaphragm:              Previously seen  Choroid Plexus:        Previously seen        Stomach:                Appears normal, left                                                                        sided  Cerebellum:            Previously seen        Abdomen:                Appears normal  Posterior Fossa:       Previously seen        Abdominal Wall:         Previously seen  Nuchal Fold:           Previously seen        Cord Vessels:           Appears normal (3  vessel cord)  Face:                  Orbits and profile     Kidneys:                Appear normal                         previously seen  Lips:                  Previously seen        Bladder:                 Appears normal  Thoracic:              Previously seen        Spine:                  Previously seen  Heart:                 Appears normal         Upper Extremities:      Previously seen                         (4CH, axis, and                         situs)  RVOT:                  Previously seen        Lower Extremities:      Previously seen  LVOT:                  Previously seen  Other:  Female gender, heels and 5th digits, nasal bone and open hands          previously visualized. Technically difficult due to fetal position and          oligohydroamnios ---------------------------------------------------------------------- Cervix Uterus Adnexa  Cervix  Length:           3.34  cm.  Normal appearance by transabdominal scan. ---------------------------------------------------------------------- Impression  Normal interval growth.  Premature rupture membranes  AMA ---------------------------------------------------------------------- Recommendations  Continue inpatient management. ----------------------------------------------------------------------               Sander Nephew, MD Electronically Signed Final Report   12/10/2018 12:57 pm ----------------------------------------------------------------------    Medications:  Scheduled . aspirin  81 mg Oral Daily  . calcium carbonate  2 tablet Oral BID  . ferrous gluconate  324 mg Oral Q breakfast  . prenatal multivitamin  1 tablet Oral Q1200  . rho (d) immune globulin  300 mcg Intravenous Once  . sodium chloride flush  3 mL Intravenous Q12H   I have reviewed the patient's current medications.  ASSESSMENT: Principal Problem:   Preterm premature rupture of membranes (PPROM) in second trimester, antepartum Active Problems:   Supervision of high-risk pregnancy   Rh negative status during pregnancy   AMA (advanced maternal age) primigravida 35+   PLAN: >s/p steroids given >s/p latency antibiotics >NICU consultation done Continue in house  observation, deliver for signs/symptoms of infection and or labor or fetal indications Continue routine antenatal care  Verita Schneiders, MD 12/26/2018,10:50 AM

## 2018-12-26 NOTE — Progress Notes (Signed)
Pt requested toco monitor be applied at 0258 on 10/21.   Pt denies feeling contractions and denies cramping but appears anxious and states that she would not be able to sleep without the contraction monitor on.   Toco applied. Instructed pt to report any changes in condition. Will continue to monitor.

## 2018-12-26 NOTE — Progress Notes (Signed)
Patient ID: Maria Reed, female   DOB: [birth year], 38 y.o.   MRN: [id number] [facility] while in OR d/t ut ctx Pt was given IV fluid bolus Ut ctx have decreased. Pt denies feeling ut ctx, no bleeding  PE AF VSS Lungs clear Heart RRR Abd soft + BS gravid non tender GU deferred  FHT's  140-150's, + Accels, occ mild ut ctx  A/P IUP 25 3/7 weeks        PROM        Ut ctx  Ctx have responded to IV fluids. Will continue. No evidence of labor or infection. Will continue to monitor.

## 2018-12-27 DIAGNOSIS — O26892 Other specified pregnancy related conditions, second trimester: Secondary | ICD-10-CM | POA: Diagnosis not present

## 2018-12-27 DIAGNOSIS — O09512 Supervision of elderly primigravida, second trimester: Secondary | ICD-10-CM | POA: Diagnosis not present

## 2018-12-27 DIAGNOSIS — O360920 Maternal care for other rhesus isoimmunization, second trimester, not applicable or unspecified: Secondary | ICD-10-CM | POA: Diagnosis not present

## 2018-12-27 DIAGNOSIS — O42912 Preterm premature rupture of membranes, unspecified as to length of time between rupture and onset of labor, second trimester: Secondary | ICD-10-CM | POA: Diagnosis not present

## 2018-12-27 LAB — CBC
HCT: 22.5 % — ABNORMAL LOW (ref 36.0–46.0)
Hemoglobin: 7.9 g/dL — ABNORMAL LOW (ref 12.0–15.0)
MCH: 35 pg — ABNORMAL HIGH (ref 26.0–34.0)
MCHC: 35.1 g/dL (ref 30.0–36.0)
MCV: 99.6 fL (ref 80.0–100.0)
Platelets: 234 10*3/uL (ref 150–400)
RBC: 2.26 MIL/uL — ABNORMAL LOW (ref 3.87–5.11)
RDW: 13.3 % (ref 11.5–15.5)
WBC: 6.5 10*3/uL (ref 4.0–10.5)
nRBC: 0 % (ref 0.0–0.2)

## 2018-12-27 LAB — TYPE AND SCREEN
ABO/RH(D): A NEG
Antibody Screen: NEGATIVE

## 2018-12-27 MED ORDER — LACTATED RINGERS IV BOLUS
1000.0000 mL | Freq: Once | INTRAVENOUS | Status: AC
  Administered 2018-12-27: 17:00:00 1000 mL via INTRAVENOUS

## 2018-12-27 NOTE — Progress Notes (Signed)
Patient ID: Maria Reed, female   DOB: 1981/01/07, 38 y.o.   MRN: [id number] [place]) NOTE  Dillen Gubser is a 38 y.o. G3P0020 at [redacted]w[redacted]d with Estimated Date of Delivery: 04/07/19 by early ultrasound  who is admitted for PPROM.   Fetal presentation is cephalic. Length of Stay:  18  Days  Date of admission:12/09/2018  Subjective: No complaints. No longer having contractions.  Patient reports the fetal movement as active. Patient reports uterine contraction  activity as none. Patient reports  vaginal bleeding as none. Patient describes fluid per vagina as clear.  Vitals:  Blood pressure 108/63, pulse 92, temperature 98.3 F (36.8 C), temperature source Oral, resp. rate 18, height 5\' 2"  (1.575 m), weight 48.4 kg, last menstrual period 07/01/2018, SpO2 100 %. Vitals:   12/26/18 2001 12/26/18 2347 12/27/18 0349 12/27/18 0803  BP: 101/65 (!) 90/52 (!) 88/53 108/63  Pulse: 84 94 77 92  Resp: 18 18 18 18   Temp: 97.9 F (36.6 C) 98.2 F (36.8 C) 97.9 F (36.6 C) 98.3 F (36.8 C)  TempSrc: Oral Oral Oral Oral  SpO2: 99% 100% 97% 100%  Weight:      Height:       Physical Examination: General appearance - alert, well appearing, and in no distress Abdomen - soft, nontender, nondistended, no masses or organomegaly Fundal Height:  size equals dates Pelvic Exam:  examination not indicated Cervical Exam: Not evaluated.  Extremities: extremities normal, atraumatic, no cyanosis or edema with DTRs 2+ bilaterally Membranes:ruptured, clear fluid  Fetal Monitoring:  Baseline: 140s bpm, Variability: Moderate (6-25 bpm), Accelerations: Appropriate for gestational age and Decelerations: Absent     Labs:  No results found for this or any previous visit (from the past 24 hour(s)).  Imaging Studies:    [place] Mfm Ob Follow Up  Result Date: 12/10/2018 ----------------------------------------------------------------------  OBSTETRICS REPORT                       (Signed  Final 12/10/2018 12:57 pm) ---------------------------------------------------------------------- Patient Info  ID #:       [id number]                          D.O.B.:  22-Jan-1981 (38 yrs)  Name:       Maria Reed                  Visit Date: 12/09/2018 05:46 pm ---------------------------------------------------------------------- Performed By  Performed By:     Wilnette Kales        Ref. Address:      Faculty                    RDMS,RVT  Attending:        Sander Nephew      [facility]                                        [place]  Referred By:      Mora Bellman MD ---------------------------------------------------------------------- Orders   #  Description  Code         Ordered By   1  [place] MFM OB FOLLOW UP                  [id number]     CHARLIE PICKENS  ----------------------------------------------------------------------   #  Order #                    Accession #                 Episode #   1  [id number]                  [id number]                  [id number]  ---------------------------------------------------------------------- Indications   Premature rupture of membranes - leaking       O42.90   fluid   Advanced maternal age multigravida 62,         O60.522   second trimester   [redacted] weeks gestation of pregnancy                Z3A.23  ---------------------------------------------------------------------- Vital Signs                                                 Height:        5'2" ---------------------------------------------------------------------- Fetal Evaluation  Num Of Fetuses:          1  Fetal Heart Rate(bpm):   132  Cardiac Activity:        Observed  Presentation:            Cephalic  Placenta:                Anterior  P. Cord Insertion:       Visualized, central  Amniotic Fluid  AFI FV:      Oligohydramnios ---------------------------------------------------------------------- Biometry  BPD:      57.5   mm     G. Age:  23w 4d         68  %    CI:        75.15   %    70 - 86                                                          FL/HC:       18.8  %    19.2 - 20.8  HC:      210.4  mm     G. Age:  23w 1d         40  %    HC/AC:       1.20       1.05 - 1.21  AC:      175.4  mm     G. Age:  22w 3d         25  %    FL/BPD:      68.7  %    71 - 87  FL:       39.5  mm     G. Age:  22w 5d  29  %    FL/AC:       22.5  %    20 - 24  Est. FW:     524   gm     1 lb 2 oz     27  % ---------------------------------------------------------------------- OB History  Gravidity:    3          SAB:   2 ---------------------------------------------------------------------- Gestational Age  LMP:           23w 0d        Date:  07/01/18                 EDD:   04/07/19  U/S Today:     23w 0d                                        EDD:   04/07/19  Best:          23w 0d     Det. By:  LMP  (07/01/18)          EDD:   04/07/19 ---------------------------------------------------------------------- Anatomy  Cranium:               Previously seen        Aortic Arch:            Previously seen  Cavum:                 Previously seen        Ductal Arch:            Previously seen  Ventricles:            Previously seen        Diaphragm:              Previously seen  Choroid Plexus:        Previously seen        Stomach:                Appears normal, left                                                                        sided  Cerebellum:            Previously seen        Abdomen:                Appears normal  Posterior Fossa:       Previously seen        Abdominal Wall:         Previously seen  Nuchal Fold:           Previously seen        Cord Vessels:           Appears normal (3  vessel cord)  Face:                  Orbits and profile     Kidneys:                Appear normal                         previously seen  Lips:                  Previously seen         Bladder:                Appears normal  Thoracic:              Previously seen        Spine:                  Previously seen  Heart:                 Appears normal         Upper Extremities:      Previously seen                         (4CH, axis, and                         situs)  RVOT:                  Previously seen        Lower Extremities:      Previously seen  LVOT:                  Previously seen  Other:  Female gender, heels and 5th digits, nasal bone and open hands          previously visualized. Technically difficult due to fetal position and          oligohydroamnios ---------------------------------------------------------------------- Cervix Uterus Adnexa  Cervix  Length:           3.34  cm.  Normal appearance by transabdominal scan. ---------------------------------------------------------------------- Impression  Normal interval growth.  Premature rupture membranes  AMA ---------------------------------------------------------------------- Recommendations  Continue inpatient management. ----------------------------------------------------------------------               Sander Nephew, MD Electronically Signed Final Report   12/10/2018 12:57 pm ----------------------------------------------------------------------    Medications:  Scheduled . aspirin  81 mg Oral Daily  . calcium carbonate  2 tablet Oral BID  . ferrous gluconate  324 mg Oral Q breakfast  . prenatal multivitamin  1 tablet Oral Q1200  . rho (d) immune globulin  300 mcg Intravenous Once  . sodium chloride flush  3 mL Intravenous Q12H   I have reviewed the patient's current medications.  ASSESSMENT: Principal Problem:   Preterm premature rupture of membranes (PPROM) in second trimester, antepartum Active Problems:   Supervision of high-risk pregnancy   Rh negative status during pregnancy   AMA (advanced maternal age) primigravida 35+   PLAN: >s/p steroids given >s/p latency antibiotics >NICU consultation  done Continue in house observation, deliver for signs/symptoms of infection and or labor or fetal indications Continue routine antenatal care  Verita Schneiders, MD 12/27/2018,10:36 AM

## 2018-12-28 DIAGNOSIS — O360931 Maternal care for other rhesus isoimmunization, third trimester, fetus 1: Secondary | ICD-10-CM

## 2018-12-28 DIAGNOSIS — O09513 Supervision of elderly primigravida, third trimester: Secondary | ICD-10-CM | POA: Diagnosis not present

## 2018-12-28 DIAGNOSIS — O42913 Preterm premature rupture of membranes, unspecified as to length of time between rupture and onset of labor, third trimester: Secondary | ICD-10-CM

## 2018-12-28 DIAGNOSIS — O26893 Other specified pregnancy related conditions, third trimester: Secondary | ICD-10-CM | POA: Diagnosis not present

## 2018-12-28 NOTE — Progress Notes (Signed)
CSW met with patient at bedside in room 109. When CSW arrived patient was resting in bed on the phone with an unidentified person.  CSW offered to return at a later time however patient insisted on meeting with CSW.   Patient was polite, easy to engage, and was receptive to meeting with CSW.  During the assessment, patient was appropriately tearful and however demonstrated good insight an awareness. Patient was also comical and demonstrated appropirate laughter throughout the assessment as well.   CSW inquired about patient's thoughts and feelings regarding her pregnant and it was evident by patient's smile that patient was excited about her pregnancy. Patient shared the she is having a boy and his name will be Biagio Quint III, however he will be called Glennon Mac.    Patient reports having a great support team that consists of patient's family and FOB's family however the majority [place] their [place] (FOB and FOB's sister are the only local supporter). Patient reported that FOB visits daily and is involved with patient's care.   CSW reflected on patient's admission and patient communicated expecting to stay in the hospital until delivery. Patient shared, "This is not they way I want it to be but Glennon Mac just don't want to do right and allow me to be at home." CSW validated and normalized MOB's thoughts and feelings and provided education regarding self care while remaining inpatient.  Patient was very open to suggested interventions (opening blinds, journal writing, coloring, playing games on her phone, and reading her bible). CSW agreed to bring patient some recreational materials on tomorrow; patient was appreciative and grateful.   CSW also suggested having someone from [place] to check in on patient weekly and patient agreed; CSW will reach out to dept and make a referral.   CSW assessed for safety and patient denied SI, HI, and DV.  Patient reported feeling  comfortable and well informed about her care.   CSW will check in with patient on tomorrow and follow up with patient as needed.  Laurey Arrow, MSW, LCSW Clinical Social Work [phone]

## 2018-12-28 NOTE — Progress Notes (Addendum)
Patient ID: Maria Reed, female   DOB: June 18, 1980, 38 y.o.   MRN: [id number] [place]) NOTE  Maria Reed is a 38 y.o. G3P0020 at [redacted]w[redacted]d with Estimated Date of Delivery: 04/07/19 by early ultrasound  who is admitted for PPROM.   Fetal presentation is cephalic. Length of Stay:  19  Days  Date of admission:12/09/2018  Subjective: No complaints. Patient reports irregular contractions several hours apart.  Patient reports the fetal movement as active. Patient reports uterine contraction  activity as none. Patient reports  vaginal bleeding as none. Patient describes fluid per vagina as clear.  Vitals:  Blood pressure (!) 91/50, pulse 85, temperature 97.8 F (36.6 C), temperature source Oral, resp. rate 18, height 5\' 2"  (1.575 m), weight 48.4 kg, last menstrual period 07/01/2018, SpO2 98 %. Vitals:   12/27/18 1922 12/27/18 2333 12/28/18 0425 12/28/18 0427  BP: 94/61 (!) 93/55  (!) 91/50  Pulse: 91 84  85  Resp: 18 18  18   Temp: (!) 97.5 F (36.4 C) 97.8 F (36.6 C)    TempSrc: Oral Oral    SpO2: 98% 100% 90% 98%  Weight:      Height:       Physical Examination: General appearance - alert, well appearing, and in no distress Abdomen - soft, nontender, nondistended, no masses or organomegaly Fundal Height:  size equals dates Pelvic Exam:  examination not indicated Cervical Exam: Not evaluated.  Extremities: extremities normal, atraumatic, no cyanosis or edema with DTRs 2+ bilaterally Membranes:ruptured, clear fluid  Fetal Monitoring:  Baseline: 135s bpm, Variability: Moderate (6-25 bpm), Accelerations: Appropriate for gestational age and Decelerations: Absent     Labs:  Results for orders placed or performed during the hospital encounter of 12/09/18 (from the past 24 hour(s))  CBC   Collection Time: 12/27/18 10:47 AM  Result Value Ref Range   WBC 6.5 4.0 - 10.5 K/uL   RBC 2.26 (L) 3.87 - 5.11 MIL/uL   Hemoglobin 7.9 (L) 12.0 - 15.0 g/dL   HCT  22.5 (L) 36.0 - 46.0 %   MCV 99.6 80.0 - 100.0 fL   MCH 35.0 (H) 26.0 - 34.0 pg   MCHC 35.1 30.0 - 36.0 g/dL   RDW 13.3 11.5 - 15.5 %   Platelets 234 150 - 400 K/uL   nRBC 0.0 0.0 - 0.2 %  Type and screen [place]   Collection Time: 12/27/18 10:47 AM  Result Value Ref Range   ABO/RH(D) A NEG    Antibody Screen NEG    Sample Expiration      12/30/2018,2359 Performed at [facility], [place] [address], [place], [place] [address]     Imaging Studies:    [place] Mfm Ob Follow Up  Result Date: 12/10/2018 ----------------------------------------------------------------------  OBSTETRICS REPORT                       (Signed Final 12/10/2018 12:57 pm) ---------------------------------------------------------------------- Patient Info  ID #:       [id number]                          D.O.B.:  1980-10-01 (38 yrs)  Name:       Maria Reed                  Visit Date: 12/09/2018 05:46 pm ---------------------------------------------------------------------- Performed By  Performed By:     Wilnette Kales        Ref.  Address:      Faculty                    RDMS,RVT  Attending:        Sander Nephew      [facility]                                        [place]  Referred By:      Mora Bellman MD ---------------------------------------------------------------------- Orders   #  Description                          Code         Ordered By   1  [place] MFM OB FOLLOW UP                  [id number]     CHARLIE PICKENS  ----------------------------------------------------------------------   #  Order #                    Accession #                 Episode #   1  [id number]                  [id number]                  [id number]  ---------------------------------------------------------------------- Indications   Premature rupture of membranes - leaking       O42.90   fluid   Advanced maternal age multigravida 46,         O20.522    second trimester   [redacted] weeks gestation of pregnancy                Z3A.23  ---------------------------------------------------------------------- Vital Signs                                                 Height:        5'2" ---------------------------------------------------------------------- Fetal Evaluation  Num Of Fetuses:          1  Fetal Heart Rate(bpm):   132  Cardiac Activity:        Observed  Presentation:            Cephalic  Placenta:                Anterior  P. Cord Insertion:       Visualized, central  Amniotic Fluid  AFI FV:      Oligohydramnios ---------------------------------------------------------------------- Biometry  BPD:      57.5  mm     G. Age:  23w 4d         68  %    CI:        75.15   %    70 - 86  FL/HC:       18.8  %    19.2 - 20.8  HC:      210.4  mm     G. Age:  23w 1d         40  %    HC/AC:       1.20       1.05 - 1.21  AC:      175.4  mm     G. Age:  22w 3d         25  %    FL/BPD:      68.7  %    71 - 87  FL:       39.5  mm     G. Age:  22w 5d         29  %    FL/AC:       22.5  %    20 - 24  Est. FW:     524   gm     1 lb 2 oz     27  % ---------------------------------------------------------------------- OB History  Gravidity:    3          SAB:   2 ---------------------------------------------------------------------- Gestational Age  LMP:           23w 0d        Date:  07/01/18                 EDD:   04/07/19  U/S Today:     23w 0d                                        EDD:   04/07/19  Best:          23w 0d     Det. By:  LMP  (07/01/18)          EDD:   04/07/19 ---------------------------------------------------------------------- Anatomy  Cranium:               Previously seen        Aortic Arch:            Previously seen  Cavum:                 Previously seen        Ductal Arch:            Previously seen  Ventricles:            Previously seen        Diaphragm:              Previously seen  Choroid Plexus:         Previously seen        Stomach:                Appears normal, left                                                                        sided  Cerebellum:            Previously seen  Abdomen:                Appears normal  Posterior Fossa:       Previously seen        Abdominal Wall:         Previously seen  Nuchal Fold:           Previously seen        Cord Vessels:           Appears normal (3                                                                        vessel cord)  Face:                  Orbits and profile     Kidneys:                Appear normal                         previously seen  Lips:                  Previously seen        Bladder:                Appears normal  Thoracic:              Previously seen        Spine:                  Previously seen  Heart:                 Appears normal         Upper Extremities:      Previously seen                         (4CH, axis, and                         situs)  RVOT:                  Previously seen        Lower Extremities:      Previously seen  LVOT:                  Previously seen  Other:  Female gender, heels and 5th digits, nasal bone and open hands          previously visualized. Technically difficult due to fetal position and          oligohydroamnios ---------------------------------------------------------------------- Cervix Uterus Adnexa  Cervix  Length:           3.34  cm.  Normal appearance by transabdominal scan. ---------------------------------------------------------------------- Impression  Normal interval growth.  Premature rupture membranes  AMA ---------------------------------------------------------------------- Recommendations  Continue inpatient management. ----------------------------------------------------------------------               Sander Nephew, MD Electronically Signed Final Report   12/10/2018 12:57 pm ----------------------------------------------------------------------    Medications:   Scheduled . aspirin  81 mg Oral Daily  . calcium carbonate  2  tablet Oral BID  . ferrous gluconate  324 mg Oral Q breakfast  . prenatal multivitamin  1 tablet Oral Q1200  . rho (d) immune globulin  300 mcg Intravenous Once  . sodium chloride flush  3 mL Intravenous Q12H   I have reviewed the patient's current medications.  ASSESSMENT: Principal Problem:   Preterm premature rupture of membranes (PPROM) in second trimester, antepartum Active Problems:   Supervision of high-risk pregnancy   Rh negative status during pregnancy   AMA (advanced maternal age) primigravida 35+   PLAN: Patient completed BMZ and latency antibiotics Deliver for signs/symptoms of infection and or labor or fetal indications Glucola next week Continue routine antenatal care  Mora Bellman, MD 12/28/2018,6:50 AM

## 2018-12-28 NOTE — Progress Notes (Signed)
CSW met with patient at patient's bedside. When CSW arrived, patient greeted CSW with a warm smile while she was eating her breakfast. CSW delivered recreational materials and patient expressed gratitude. Patient was talkative and openly shared her food graving's and desires since pregnancy. Patient also shared feeling excited but yet anxious about being a new.   CSW had no concerns and will continue to offer patient resources and supports while patient remains inpatient.   Laurey Arrow, MSW, LCSW Clinical Social Work [phone]

## 2018-12-29 DIAGNOSIS — O42112 Preterm premature rupture of membranes, onset of labor more than 24 hours following rupture, second trimester: Secondary | ICD-10-CM

## 2018-12-29 DIAGNOSIS — O360920 Maternal care for other rhesus isoimmunization, second trimester, not applicable or unspecified: Secondary | ICD-10-CM | POA: Diagnosis not present

## 2018-12-29 DIAGNOSIS — O26892 Other specified pregnancy related conditions, second trimester: Secondary | ICD-10-CM | POA: Diagnosis not present

## 2018-12-29 DIAGNOSIS — O09519 Supervision of elderly primigravida, unspecified trimester: Secondary | ICD-10-CM

## 2018-12-29 NOTE — Progress Notes (Signed)
FACULTY PRACTICE ANTEPARTUM(COMPREHENSIVE) NOTE  Maria Reed is a 38 y.o. G3P0020 at [redacted]w[redacted]d  who is admitted for rupture of membranes.   Fetal presentation is cephalic. Next u/s at 27-28 wk Length of Stay:  20  Days  Subjective: No contractions, no c/o Patient reports the fetal movement as active. Patient reports uterine contraction  activity as none. Patient reports  vaginal bleeding as none. Patient describes fluid per vagina as Other minimal.  Vitals:  Blood pressure (!) 91/58, pulse 87, temperature 97.8 F (36.6 C), temperature source Oral, resp. rate 16, height 5\' 2"  (1.575 m), weight 48.4 kg, last menstrual period 07/01/2018, SpO2 98 %. Physical Examination:  General appearance - alert, well appearing, and in no distress, oriented to person, place, and time and normal appearing weight Heart - normal rate and regular rhythm Abdomen - soft, nontender, nondistended Fundal Height:  size equals dates Cervical Exam: Not evaluated. aExtremities: extremities normal, atraumatic, no cyanosis or edema and Homans sign is negative, no sign of DVT with DTRs 2+ bilaterally Membranes:ruptured  Fetal Monitoring:  Baseline: 140 bpm, Variability: Good {> 6 bpm), Accelerations: Reactive and Decelerations: Absent  Labs:  No results found for this or any previous visit (from the past 24 hour(s)).  Imaging Studies:    U/s at admit at 23 wk.10/4 Currently EPIC will not allow sonographic studies to automatically populate into notes.  In the meantime, copy and paste results into note or free text.  Medications:  Scheduled . aspirin  81 mg Oral Daily  . calcium carbonate  2 tablet Oral BID  . ferrous gluconate  324 mg Oral Q breakfast  . prenatal multivitamin  1 tablet Oral Q1200  . rho (d) immune globulin  300 mcg Intravenous Once  . sodium chloride flush  3 mL Intravenous Q12H   I have reviewed the patient's current medications.  ASSESSMENT: Patient Active Problem List   Diagnosis Date  Noted  . Preterm premature rupture of membranes (PPROM) in second trimester, antepartum 12/09/2018  . Supervision of high-risk pregnancy 09/17/2018  . Rh negative status during pregnancy 09/17/2018  . AMA (advanced maternal age) primigravida 35+ 09/17/2018    PLAN: inpt care til delivery at 23 wk or as clinically indicated  Growth scan q 4w Patient completed BMZ and latency antibiotics Deliver for signs/symptoms of infection and or labor or fetal indications Glucola next week Continue routine antenatal care Jonnie Kind 12/29/2018,7:13 AM    Patient ID: Maria Reed, female   DOB: 07/25/1980, 38 y.o.   MRN: [id number]

## 2018-12-30 DIAGNOSIS — O42112 Preterm premature rupture of membranes, onset of labor more than 24 hours following rupture, second trimester: Secondary | ICD-10-CM | POA: Diagnosis not present

## 2018-12-30 DIAGNOSIS — Z3A26 26 weeks gestation of pregnancy: Secondary | ICD-10-CM

## 2018-12-30 LAB — CBC
HCT: 22.9 % — ABNORMAL LOW (ref 36.0–46.0)
Hemoglobin: 7.8 g/dL — ABNORMAL LOW (ref 12.0–15.0)
MCH: 35 pg — ABNORMAL HIGH (ref 26.0–34.0)
MCHC: 34.1 g/dL (ref 30.0–36.0)
MCV: 102.7 fL — ABNORMAL HIGH (ref 80.0–100.0)
Platelets: 222 10*3/uL (ref 150–400)
RBC: 2.23 MIL/uL — ABNORMAL LOW (ref 3.87–5.11)
RDW: 13.5 % (ref 11.5–15.5)
WBC: 5.6 10*3/uL (ref 4.0–10.5)
nRBC: 0 % (ref 0.0–0.2)

## 2018-12-30 LAB — TYPE AND SCREEN
ABO/RH(D): A NEG
Antibody Screen: NEGATIVE

## 2018-12-30 NOTE — Progress Notes (Deleted)
Labs sent to lab by phlebotomist via pneumatic tubs station.

## 2018-12-30 NOTE — Progress Notes (Signed)
CBC and T/S drawn off of midline with phlebotomist present, pt. Identified properly by RN and phlebotomist. Sent to lab by phlebotomist. Line flushed and IVF restarted.

## 2018-12-30 NOTE — Progress Notes (Signed)
Per patient request, toco monitor applied "to see if I have back contractions." Pt's abdomen palpated and is soft.

## 2018-12-30 NOTE — Progress Notes (Signed)
Patient ID: Maria Reed, female   DOB: 1980-03-15, 38 y.o.   MRN: [id number] [place]) NOTE  Maria Reed is a 38 y.o. G3P0020 at [redacted]w[redacted]d who is admitted for rupture of membranes.   Fetal presentation is cephalic. Length of Stay:  21  Days  Subjective: Few contractions last night have resolved Patient reports the fetal movement as active. Patient reports uterine contraction  activity as none. Patient reports  vaginal bleeding as none. Patient describes fluid per vagina as Clear.  Vitals:  Blood pressure 100/70, pulse 78, temperature 97.9 F (36.6 C), temperature source Oral, resp. rate 18, height 5\' 2"  (1.575 m), weight 48.4 kg, last menstrual period 07/01/2018, SpO2 99 %. Physical Examination:  General appearance - alert, well appearing, and in no distress Heart - normal rate and regular rhythm Abdomen - soft, nontender, nondistended Fundal Height:  size equals dates Cervical Exam: Not evaluated.  Extremities: extremities normal, atraumatic, no cyanosis or edema and Homans sign is negative, no sign of DVT with DTRs 2+ bilaterally Membranes:intact  Fetal Monitoring:  Fetal Heart Rate A  Mode External filed at 12/29/2018 2220  Baseline Rate (A) 145 bpm filed at 12/29/2018 2220  Variability 6-25 BPM filed at 12/29/2018 2220  Accelerations 15 x 15 filed at 12/29/2018 2220  Decelerations Variable filed at 12/29/2018 2220  Multiple birth? N filed at 12/29/2018 1112     Labs:  No results found for this or any previous visit (from the past 24 hour(s)).   Medications:  Scheduled . aspirin  81 mg Oral Daily  . calcium carbonate  2 tablet Oral BID  . ferrous gluconate  324 mg Oral Q breakfast  . prenatal multivitamin  1 tablet Oral Q1200  . rho (d) immune globulin  300 mcg Intravenous Once   I have reviewed the patient's current medications.  ASSESSMENT: Patient Active Problem List   Diagnosis Date Noted  . Preterm premature rupture of  membranes (PPROM) in second trimester, antepartum 12/09/2018  . Supervision of high-risk pregnancy 09/17/2018  . Rh negative status during pregnancy 09/17/2018  . AMA (advanced maternal age) primigravida 35+ 09/17/2018    PLAN: Continue hospitalization and observe for preterm labor, infection  [name] 12/30/2018,7:15 AM

## 2018-12-31 ENCOUNTER — Inpatient Hospital Stay (HOSPITAL_COMMUNITY): Payer: Medicaid Other

## 2018-12-31 DIAGNOSIS — O4103X0 Oligohydramnios, third trimester, not applicable or unspecified: Secondary | ICD-10-CM

## 2018-12-31 DIAGNOSIS — O42913 Preterm premature rupture of membranes, unspecified as to length of time between rupture and onset of labor, third trimester: Secondary | ICD-10-CM | POA: Diagnosis not present

## 2018-12-31 DIAGNOSIS — Z3A26 26 weeks gestation of pregnancy: Secondary | ICD-10-CM | POA: Diagnosis not present

## 2018-12-31 DIAGNOSIS — Z362 Encounter for other antenatal screening follow-up: Secondary | ICD-10-CM

## 2018-12-31 DIAGNOSIS — O09522 Supervision of elderly multigravida, second trimester: Secondary | ICD-10-CM

## 2018-12-31 DIAGNOSIS — O42112 Preterm premature rupture of membranes, onset of labor more than 24 hours following rupture, second trimester: Secondary | ICD-10-CM | POA: Diagnosis not present

## 2018-12-31 MED ORDER — LACTATED RINGERS IV SOLN
INTRAVENOUS | Status: DC
  Administered 2019-01-02 – 2019-01-03 (×2): via INTRAVENOUS

## 2018-12-31 NOTE — Progress Notes (Signed)
Patient ID: Maria Reed, female   DOB: 06/29/1980, 38 y.o.   MRN: [id number] [place]) NOTE  Maria Reed is a 38 y.o. G3P0020 at [redacted]w[redacted]d who is admitted for rupture of membranes.   Fetal presentation is cephalic. Length of Stay:  22  Days  Subjective: Patient reports feeling well this morning without contractions overnight. She reports persistent leakage of fluid occasionally Patient reports the fetal movement as active. Patient reports uterine contraction  activity as none. Patient reports  vaginal bleeding as none. Patient describes fluid per vagina as Clear.  Vitals:  Blood pressure (!) 103/57, pulse 91, temperature 98 F (36.7 C), temperature source Oral, resp. rate 16, height 5\' 2"  (1.575 m), weight 48.4 kg, last menstrual period 07/01/2018, SpO2 98 %. Physical Examination: GENERAL: Well-developed, well-nourished female in no acute distress.  LUNGS: Clear to auscultation bilaterally.  HEART: Regular rate and rhythm. ABDOMEN: Soft, nontender, gravid EXTREMITIES: No cyanosis, clubbing, or edema, 2+ distal pulses.   Fetal Monitoring: baseline 140, mod variability, + accels, no decels Toco: no contractions    Labs:  Results for orders placed or performed during the hospital encounter of 12/09/18 (from the past 24 hour(s))  CBC   Collection Time: 12/30/18 11:30 AM  Result Value Ref Range   WBC 5.6 4.0 - 10.5 K/uL   RBC 2.23 (L) 3.87 - 5.11 MIL/uL   Hemoglobin 7.8 (L) 12.0 - 15.0 g/dL   HCT 22.9 (L) 36.0 - 46.0 %   MCV 102.7 (H) 80.0 - 100.0 fL   MCH 35.0 (H) 26.0 - 34.0 pg   MCHC 34.1 30.0 - 36.0 g/dL   RDW 13.5 11.5 - 15.5 %   Platelets 222 150 - 400 K/uL   nRBC 0.0 0.0 - 0.2 %  Type and screen [place]   Collection Time: 12/30/18 11:30 AM  Result Value Ref Range   ABO/RH(D) A NEG    Antibody Screen NEG    Sample Expiration      01/02/2019,2359 Performed at [facility], [place] [address], [place],  [place] [address]      Medications:  Scheduled . aspirin  81 mg Oral Daily  . calcium carbonate  2 tablet Oral BID  . ferrous gluconate  324 mg Oral Q breakfast  . prenatal multivitamin  1 tablet Oral Q1200  . rho (d) immune globulin  300 mcg Intravenous Once   I have reviewed the patient's current medications.  ASSESSMENT: Patient Active Problem List   Diagnosis Date Noted  . Preterm premature rupture of membranes (PPROM) in second trimester, antepartum 12/09/2018  . Supervision of high-risk pregnancy 09/17/2018  . Rh negative status during pregnancy 09/17/2018  . AMA (advanced maternal age) primigravida 35+ 09/17/2018    PLAN: - Growth ultrasound today - Glucola and third trimester labs this week- Patient prefers to have it done Wednesday - Continue monitoring for si/sx of chorioamnionitis - Maternal- fetal status stable  Tykeem Lanzer 12/31/2018,9:04 AM

## 2019-01-01 DIAGNOSIS — O42112 Preterm premature rupture of membranes, onset of labor more than 24 hours following rupture, second trimester: Secondary | ICD-10-CM | POA: Diagnosis not present

## 2019-01-01 DIAGNOSIS — Z3A26 26 weeks gestation of pregnancy: Secondary | ICD-10-CM | POA: Diagnosis not present

## 2019-01-01 NOTE — Progress Notes (Signed)
Initial visit with Maria Reed to introduce spiritual care services and offer support as she is in week three of her hospital stay.  She shared that baby Randall Hiss "Glennon Mac" is very active and causing her a lot of discomfort.  She is upbeat and optimistic about her current situation, just trying to focus on one day at a time.  She has good support in her fiance and plans to move home to [place] after baby is born to be closer to family.  She took leave from her work because of concerns about Covid, which is proving to be a relief right now.  Maria Reed had two miscarriages prior one in May and one in December and did not want to take any risks with this pregnancy.  She is in good spirits and appreciative of company.  Please page as further needs arise.  Donald Prose. Elyn Peers, M.Div. [facility] Chaplain Pager [phone] Office [phone]

## 2019-01-01 NOTE — Progress Notes (Addendum)
Pt had c/o's of right sided abdominal pain. EFM applied. Will continue to monitor.   Fetal HR reassuring. Monitors removed.

## 2019-01-01 NOTE — Progress Notes (Signed)
CSW met with MOB at bedside and introduced MOB to CSW (M. Burcham). CSWs met with MOB and spoke at length about her experience at the hospital. MOB reported that she is doing well today and had a good night's rest. MOB seemed appreciative of visit. CSW will continue to meet with MOB on a regular basis to provide emotional support while she is admitted to OB High Risk Unit.   Kimberly Long, LCSW Clinical Social Worker [facility] Cell#: [phone]  

## 2019-01-01 NOTE — Progress Notes (Signed)
Patient ID: Maria Reed, female   DOB: June 10, 1980, 38 y.o.   MRN: [id number] [place]) NOTE  Maria Reed is a 38 y.o. G3P0020 at [redacted]w[redacted]d who is admitted for rupture of membranes.   Fetal presentation is cephalic. Length of Stay:  23  Days  Subjective: Patient reports feeling well this morning without complaints Patient reports the fetal movement as active. Patient reports uterine contraction  activity as none. Patient reports  vaginal bleeding as none. Patient describes fluid per vagina as Clear.  Vitals:  Blood pressure (!) 100/54, pulse 84, temperature 98.1 F (36.7 C), temperature source Oral, resp. rate 18, height 5\' 2"  (1.575 m), weight 48.4 kg, last menstrual period 07/01/2018, SpO2 100 %. Physical Examination: GENERAL: Well-developed, well-nourished female in no acute distress.  LUNGS: Clear to auscultation bilaterally.  HEART: Regular rate and rhythm. ABDOMEN: Soft, nontender, gravid EXTREMITIES: No cyanosis, clubbing, or edema, 2+ distal pulses.   Fetal Monitoring: baseline 145, mod variability, + accels, no decels Toco: no contractions    Labs:  No results found for this or any previous visit (from the past 24 hour(s)).  [place] Mfm Ob Follow Up  Result Date: 12/31/2018 ----------------------------------------------------------------------  OBSTETRICS REPORT                       (Signed Final 12/31/2018 12:40 pm) ---------------------------------------------------------------------- Patient Info  ID #:       [id number]                          D.O.B.:  [birth year] (38 yrs)  Name:       Maria Reed                  Visit Date: 12/31/2018 09:08 am ---------------------------------------------------------------------- Performed By  Performed By:     Maria Reed          Ref. Address:     Faculty                    RDMS  Attending:        Tama High Reed        [facility]  Referred By:      Maria Reed ---------------------------------------------------------------------- Orders   #  Description                          Code         Ordered By   1  [place] MFM OB FOLLOW UP                  [id number]     Maria Reed  ----------------------------------------------------------------------   #  Order #                    Accession #  Episode #   1  [id number]                  [id number]                  [id number]  ---------------------------------------------------------------------- Indications   [redacted] weeks gestation of pregnancy                Z3A.26   Encounter for other antenatal screening        Z36.2   follow-up   Premature rupture of membranes - leaking       O42.90   fluid   Advanced maternal age multigravida 37,         O67.522   second trimester   Oligohydraminios, third trimester, unspecified O41.03X0  ---------------------------------------------------------------------- Vital Signs                                                 Height:        5'2" ---------------------------------------------------------------------- Fetal Evaluation  Num Of Fetuses:         1  Fetal Heart Rate(bpm):  137  Cardiac Activity:       Observed  Presentation:           Cephalic  Placenta:               Anterior  P. Cord Insertion:      Previously Visualized  Amniotic Fluid  AFI FV:      Oligohydramnios  AFI Sum(cm)     %Tile       Largest Pocket(cm)  3.33            < 3         1.46  RUQ(cm)       RLQ(cm)       LUQ(cm)        LLQ(cm)  0.97          0.9           1.46           0 ---------------------------------------------------------------------- Biometry  BPD:      65.8  mm     G. Age:  26w 4d         54  %    CI:        75.57   %    70 - 86                                                          FL/HC:      19.0   %    18.6 - 20.4  HC:       240   mm     G. Age:  26w 0d         23  %    HC/AC:      1.15        1.04 - 1.22  AC:      208.8   mm     G. Age:  25w 3d         21  %    FL/BPD:     69.3   %  71 - 87  FL:       45.6  mm     G. Age:  25w 1d         11  %    FL/AC:      21.8   %    20 - 24  HUM:      41.3  mm     G. Age:  25w 0d         16  %  Est. FW:     810  gm    1 lb 13 oz      15  % ---------------------------------------------------------------------- OB History  Gravidity:    3          SAB:   2 ---------------------------------------------------------------------- Gestational Age  LMP:           26w 1d        Date:  07/01/18                 EDD:   04/07/19  U/S Today:     25w 6d                                        EDD:   04/09/19  Best:          26w 1d     Det. By:  LMP  (07/01/18)          EDD:   04/07/19 ---------------------------------------------------------------------- Anatomy  Cranium:               Previously seen        Aortic Arch:            Previously seen  Cavum:                 Previously seen        Ductal Arch:            Previously seen  Ventricles:            Previously seen        Diaphragm:              Previously seen  Choroid Plexus:        Previously seen        Stomach:                Appears normal, left                                                                        sided  Cerebellum:            Previously seen        Abdomen:                Appears normal  Posterior Fossa:       Previously seen        Abdominal Wall:         Previously seen  Nuchal Fold:           Previously seen        Cord Vessels:  Appears normal (3                                                                        vessel cord)  Face:                  Orbits and profile     Kidneys:                Appear normal                         previously seen  Lips:                  Previously seen        Bladder:                Appears normal  Thoracic:              Previously seen        Spine:                  Previously seen  Heart:                 Appears normal         Upper Extremities:      Previously seen                          (4CH, axis, and                         situs)  RVOT:                  Previously seen        Lower Extremities:      Previously seen  LVOT:                  Appears normal  Other:  Female gender, heels and 5th digits, nasal bone and open hands          previously visualized. Technically difficult due to fetal position and          oligohydroamnios ---------------------------------------------------------------------- Cervix Uterus Adnexa  Cervix  Not visualized (advanced GA >24wks)  Uterus  No abnormality visualized.  Left Ovary  No adnexal mass visualized.  Right Ovary  No adnexal mass visualized.  Cul De Sac  No free fluid seen.  Adnexa  No abnormality visualized. ---------------------------------------------------------------------- Impression  Patient is admitted with PPROM.  Fetal growth is appropriate for gestational age. Cephalic  presentation. Oligohydramnios (consistent with PPROM) is  seen again. ---------------------------------------------------------------------- Recommendations  -Follow-up limited ultrasound next week. ----------------------------------------------------------------------                  Maria High, Reed Electronically Signed Final Report   12/31/2018 12:40 pm ----------------------------------------------------------------------  [place] Mfm Ob Follow Up  Result Date: 12/10/2018 ----------------------------------------------------------------------  OBSTETRICS REPORT                       (Signed Final 12/10/2018 12:57 pm) ---------------------------------------------------------------------- Patient Info  ID #:       [id number]  D.O.B.:  11-Aug-1980 (38 yrs)  Name:       Maria Reed                  Visit Date: 12/09/2018 05:46 pm ---------------------------------------------------------------------- Performed By  Performed By:     Wilnette Kales        Ref. Address:      Faculty                    RDMS,RVT  Attending:        Sander Nephew       [facility]                                        [place]  Referred By:      Maria Reed ---------------------------------------------------------------------- Orders   #  Description                          Code         Ordered By   1  [place] MFM OB FOLLOW UP                  [id number]     CHARLIE PICKENS  ----------------------------------------------------------------------   #  Order #                    Accession #                 Episode #   1  [id number]                  [id number]                  [id number]  ---------------------------------------------------------------------- Indications   Premature rupture of membranes - leaking       O42.90   fluid   Advanced maternal age multigravida 45,         O45.522   second trimester   [redacted] weeks gestation of pregnancy                Z3A.23  ---------------------------------------------------------------------- Vital Signs                                                 Height:        5'2" ---------------------------------------------------------------------- Fetal Evaluation  Num Of Fetuses:          1  Fetal Heart Rate(bpm):   132  Cardiac Activity:        Observed  Presentation:            Cephalic  Placenta:                Anterior  P. Cord Insertion:       Visualized, central  Amniotic Fluid  AFI FV:      Oligohydramnios ---------------------------------------------------------------------- Biometry  BPD:      57.5  mm  G. Age:  23w 4d         68  %    CI:        75.15   %    70 - 86                                                          FL/HC:       18.8  %    19.2 - 20.8  HC:      210.4  mm     G. Age:  23w 1d         40  %    HC/AC:       1.20       1.05 - 1.21  AC:      175.4  mm     G. Age:  22w 3d         25  %    FL/BPD:      68.7  %    71 - 87  FL:       39.5  mm     G. Age:  22w 5d         29  %    FL/AC:       22.5  %    20 - 24  Est. FW:     524   gm     1 lb 2 oz     27  %  ---------------------------------------------------------------------- OB History  Gravidity:    3          SAB:   2 ---------------------------------------------------------------------- Gestational Age  LMP:           23w 0d        Date:  07/01/18                 EDD:   04/07/19  U/S Today:     23w 0d                                        EDD:   04/07/19  Best:          23w 0d     Det. By:  LMP  (07/01/18)          EDD:   04/07/19 ---------------------------------------------------------------------- Anatomy  Cranium:               Previously seen        Aortic Arch:            Previously seen  Cavum:                 Previously seen        Ductal Arch:            Previously seen  Ventricles:            Previously seen        Diaphragm:              Previously seen  Choroid Plexus:        Previously seen        Stomach:                Appears  normal, left                                                                        sided  Cerebellum:            Previously seen        Abdomen:                Appears normal  Posterior Fossa:       Previously seen        Abdominal Wall:         Previously seen  Nuchal Fold:           Previously seen        Cord Vessels:           Appears normal (3                                                                        vessel cord)  Face:                  Orbits and profile     Kidneys:                Appear normal                         previously seen  Lips:                  Previously seen        Bladder:                Appears normal  Thoracic:              Previously seen        Spine:                  Previously seen  Heart:                 Appears normal         Upper Extremities:      Previously seen                         (4CH, axis, and                         situs)  RVOT:                  Previously seen        Lower Extremities:      Previously seen  LVOT:                  Previously seen  Other:  Female gender, heels and 5th digits, nasal bone and open hands           previously visualized. Technically difficult due to fetal position and  oligohydroamnios ---------------------------------------------------------------------- Cervix Uterus Adnexa  Cervix  Length:           3.34  cm.  Normal appearance by transabdominal scan. ---------------------------------------------------------------------- Impression  Normal interval growth.  Premature rupture membranes  AMA ---------------------------------------------------------------------- Recommendations  Continue inpatient management. ----------------------------------------------------------------------               Sander Nephew, Reed Electronically Signed Final Report   12/10/2018 12:57 pm ----------------------------------------------------------------------   Medications:  Scheduled . aspirin  81 mg Oral Daily  . calcium carbonate  2 tablet Oral BID  . ferrous gluconate  324 mg Oral Q breakfast  . prenatal multivitamin  1 tablet Oral Q1200  . rho (d) immune globulin  300 mcg Intravenous Once   I have reviewed the patient's current medications.  ASSESSMENT: Patient Active Problem List   Diagnosis Date Noted  . Preterm premature rupture of membranes (PPROM) in second trimester, antepartum 12/09/2018  . Supervision of Reed-risk pregnancy 09/17/2018  . Rh negative status during pregnancy 09/17/2018  . AMA (advanced maternal age) primigravida 35+ 09/17/2018    PLAN: - Glucola and third trimester labs tomorrow - Will re-consult neonatology as patient has several questions - Continue monitoring for si/sx of chorioamnionitis - Maternal- fetal status stable  Maria Reed 01/01/2019,10:59 AM

## 2019-01-02 DIAGNOSIS — O42112 Preterm premature rupture of membranes, onset of labor more than 24 hours following rupture, second trimester: Secondary | ICD-10-CM | POA: Diagnosis not present

## 2019-01-02 DIAGNOSIS — Z3A26 26 weeks gestation of pregnancy: Secondary | ICD-10-CM | POA: Diagnosis not present

## 2019-01-02 LAB — GLUCOSE, 2 HOUR: Glucose, 2 hour: 108 mg/dL (ref 70–139)

## 2019-01-02 LAB — CBC
HCT: 28.2 % — ABNORMAL LOW (ref 36.0–46.0)
Hemoglobin: 9.7 g/dL — ABNORMAL LOW (ref 12.0–15.0)
MCH: 34.6 pg — ABNORMAL HIGH (ref 26.0–34.0)
MCHC: 34.4 g/dL (ref 30.0–36.0)
MCV: 100.7 fL — ABNORMAL HIGH (ref 80.0–100.0)
Platelets: 227 10*3/uL (ref 150–400)
RBC: 2.8 MIL/uL — ABNORMAL LOW (ref 3.87–5.11)
RDW: 13.5 % (ref 11.5–15.5)
WBC: 5.2 10*3/uL (ref 4.0–10.5)
nRBC: 0 % (ref 0.0–0.2)

## 2019-01-02 LAB — TYPE AND SCREEN
ABO/RH(D): A NEG
Antibody Screen: NEGATIVE

## 2019-01-02 LAB — GLUCOSE, FASTING: Glucose, Fasting: 75 mg/dL (ref 70–99)

## 2019-01-02 LAB — GLUCOSE TOLERANCE, 1 HOUR: Glucose, 1 Hour GTT: 139 mg/dL (ref 70–140)

## 2019-01-02 MED ORDER — SODIUM CHLORIDE 0.9 % IV SOLN
510.0000 mg | INTRAVENOUS | Status: AC
  Administered 2019-01-02 – 2019-01-09 (×2): 510 mg via INTRAVENOUS
  Filled 2019-01-02 (×2): qty 17

## 2019-01-02 NOTE — Progress Notes (Signed)
Patient ID: Maria Reed, female   DOB: 1980-12-11, 39 y.o.   MRN: [id number] [place]) NOTE  Maria Reed is a 38 y.o. G3P0020 at [redacted]w[redacted]d who is admitted for rupture of membranes.   Fetal presentation is cephalic. Length of Stay:  24  Days  Subjective: Patient reports feeling well this morning and is without complaints Patient reports the fetal movement as active. Patient reports uterine contraction  activity as none. Patient reports  vaginal bleeding as none. Patient describes fluid per vagina as Clear.  Vitals:  Blood pressure (!) 99/59, pulse 79, temperature 97.9 F (36.6 C), temperature source Oral, resp. rate 18, height 5\' 2"  (1.575 m), weight 48.4 kg, last menstrual period 07/01/2018, SpO2 99 %. Physical Examination: GENERAL: Well-developed, well-nourished female in no acute distress.  LUNGS: Clear to auscultation bilaterally.  HEART: Regular rate and rhythm. ABDOMEN: Soft, nontender, gravid EXTREMITIES: No cyanosis, clubbing, or edema, 2+ distal pulses.   Fetal Monitoring: baseline 135, mod variability, + accels, no decels Toco: no contractions    Labs:  Results for orders placed or performed during the hospital encounter of 12/09/18 (from the past 24 hour(s))  Glucose, fasting   Collection Time: 01/02/19  5:00 AM  Result Value Ref Range   Glucose, Fasting 75 70 - 99 mg/dL  Glucose tolerance, 1 hour   Collection Time: 01/02/19  5:00 AM  Result Value Ref Range   Glucose, 1 Hour GTT 75 70 - 140 mg/dL  Type and screen [place]   Collection Time: 01/02/19  5:50 AM  Result Value Ref Range   ABO/RH(D) A NEG    Antibody Screen NEG    Sample Expiration      01/05/2019,2359 Performed at [facility], [place] [address] [place], [place] [address]   Glucose, 2 hour   Collection Time: 01/02/19  6:08 AM  Result Value Ref Range   Glucose, 2 hour 139 70 - 139 mg/dL    [place] Mfm Ob Follow Up  Result Date:  12/31/2018 ----------------------------------------------------------------------  OBSTETRICS REPORT                       (Signed Final 12/31/2018 12:40 pm) ---------------------------------------------------------------------- Patient Info  ID #:       [id number]                          D.O.B.:  [birth year] (38 yrs)  Name:       Maria Reed                  Visit Date: 12/31/2018 09:08 am ---------------------------------------------------------------------- Performed By  Performed By:     Valda Favia          Ref. Address:     Faculty                    RDMS  Attending:        Tama High MD        [facility]  Referred By:  Dilan Fullenwider MD ---------------------------------------------------------------------- Orders   #  Description                          Code         Ordered By   1  [place] MFM OB FOLLOW UP                  [id number]     KELLY DAVIS  ----------------------------------------------------------------------   #  Order #                    Accession #                 Episode #   1  [id number]                  [id number]                  [id number]  ---------------------------------------------------------------------- Indications   [redacted] weeks gestation of pregnancy                Z3A.26   Encounter for other antenatal screening        Z36.2   follow-up   Premature rupture of membranes - leaking       O42.90   fluid   Advanced maternal age multigravida 83,         O11.522   second trimester   Oligohydraminios, third trimester, unspecified O41.03X0  ---------------------------------------------------------------------- Vital Signs                                                 Height:        5'2" ---------------------------------------------------------------------- Fetal Evaluation  Num Of Fetuses:         1  Fetal Heart Rate(bpm):  137  Cardiac Activity:       Observed  Presentation:            Cephalic  Placenta:               Anterior  P. Cord Insertion:      Previously Visualized  Amniotic Fluid  AFI FV:      Oligohydramnios  AFI Sum(cm)     %Tile       Largest Pocket(cm)  3.33            < 3         1.46  RUQ(cm)       RLQ(cm)       LUQ(cm)        LLQ(cm)  0.97          0.9           1.46           0 ---------------------------------------------------------------------- Biometry  BPD:      65.8  mm     G. Age:  26w 4d         54  %    CI:        75.57   %    70 - 86  FL/HC:      19.0   %    18.6 - 20.4  HC:       240   mm     G. Age:  26w 0d         23  %    HC/AC:      1.15        1.04 - 1.22  AC:      208.8  mm     G. Age:  25w 3d         21  %    FL/BPD:     69.3   %    71 - 87  FL:       45.6  mm     G. Age:  25w 1d         11  %    FL/AC:      21.8   %    20 - 24  HUM:      41.3  mm     G. Age:  25w 0d         16  %  Est. FW:     810  gm    1 lb 13 oz      15  % ---------------------------------------------------------------------- OB History  Gravidity:    3          SAB:   2 ---------------------------------------------------------------------- Gestational Age  LMP:           26w 1d        Date:  07/01/18                 EDD:   04/07/19  U/S Today:     25w 6d                                        EDD:   04/09/19  Best:          26w 1d     Det. By:  LMP  (07/01/18)          EDD:   04/07/19 ---------------------------------------------------------------------- Anatomy  Cranium:               Previously seen        Aortic Arch:            Previously seen  Cavum:                 Previously seen        Ductal Arch:            Previously seen  Ventricles:            Previously seen        Diaphragm:              Previously seen  Choroid Plexus:        Previously seen        Stomach:                Appears normal, left  sided  Cerebellum:            Previously seen         Abdomen:                Appears normal  Posterior Fossa:       Previously seen        Abdominal Wall:         Previously seen  Nuchal Fold:           Previously seen        Cord Vessels:           Appears normal (3                                                                        vessel cord)  Face:                  Orbits and profile     Kidneys:                Appear normal                         previously seen  Lips:                  Previously seen        Bladder:                Appears normal  Thoracic:              Previously seen        Spine:                  Previously seen  Heart:                 Appears normal         Upper Extremities:      Previously seen                         (4CH, axis, and                         situs)  RVOT:                  Previously seen        Lower Extremities:      Previously seen  LVOT:                  Appears normal  Other:  Female gender, heels and 5th digits, nasal bone and open hands          previously visualized. Technically difficult due to fetal position and          oligohydroamnios ---------------------------------------------------------------------- Cervix Uterus Adnexa  Cervix  Not visualized (advanced GA >24wks)  Uterus  No abnormality visualized.  Left Ovary  No adnexal mass visualized.  Right Ovary  No adnexal mass visualized.  Cul De Sac  No free fluid seen.  Adnexa  No abnormality visualized. ---------------------------------------------------------------------- Impression  Patient is admitted with PPROM.  Fetal growth is appropriate for gestational age. Cephalic  presentation. Oligohydramnios (  consistent with PPROM) is  seen again. ---------------------------------------------------------------------- Recommendations  -Follow-up limited ultrasound next week. ----------------------------------------------------------------------                  Tama High, MD Electronically Signed Final Report   12/31/2018 12:40 pm  ----------------------------------------------------------------------  [place] Mfm Ob Follow Up  Result Date: 12/10/2018 ----------------------------------------------------------------------  OBSTETRICS REPORT                       (Signed Final 12/10/2018 12:57 pm) ---------------------------------------------------------------------- Patient Info  ID #:       [id number]                          D.O.B.:  23-May-1980 (38 yrs)  Name:       Maria Reed                  Visit Date: 12/09/2018 05:46 pm ---------------------------------------------------------------------- Performed By  Performed By:     Wilnette Kales        Ref. Address:      Faculty                    RDMS,RVT  Attending:        Sander Nephew      [facility]                                        [place]  Referred By:      Mora Bellman MD ---------------------------------------------------------------------- Orders   #  Description                          Code         Ordered By   1  [place] MFM OB FOLLOW UP                  [id number]     CHARLIE PICKENS  ----------------------------------------------------------------------   #  Order #                    Accession #                 Episode #   1  [id number]                  [id number]                  [id number]  ---------------------------------------------------------------------- Indications   Premature rupture of membranes - leaking       O42.90   fluid   Advanced maternal age multigravida 16,         O97.522   second trimester   [redacted] weeks gestation of pregnancy                Z3A.23  ---------------------------------------------------------------------- Vital Signs  Height:        5'2" ---------------------------------------------------------------------- Fetal Evaluation  Num Of Fetuses:          1  Fetal Heart Rate(bpm):   132  Cardiac Activity:        Observed  Presentation:             Cephalic  Placenta:                Anterior  P. Cord Insertion:       Visualized, central  Amniotic Fluid  AFI FV:      Oligohydramnios ---------------------------------------------------------------------- Biometry  BPD:      57.5  mm     G. Age:  23w 4d         68  %    CI:        75.15   %    70 - 86                                                          FL/HC:       18.8  %    19.2 - 20.8  HC:      210.4  mm     G. Age:  23w 1d         40  %    HC/AC:       1.20       1.05 - 1.21  AC:      175.4  mm     G. Age:  22w 3d         25  %    FL/BPD:      68.7  %    71 - 87  FL:       39.5  mm     G. Age:  22w 5d         29  %    FL/AC:       22.5  %    20 - 24  Est. FW:     524   gm     1 lb 2 oz     27  % ---------------------------------------------------------------------- OB History  Gravidity:    3          SAB:   2 ---------------------------------------------------------------------- Gestational Age  LMP:           23w 0d        Date:  07/01/18                 EDD:   04/07/19  U/S Today:     23w 0d                                        EDD:   04/07/19  Best:          23w 0d     Det. By:  LMP  (07/01/18)          EDD:   04/07/19 ---------------------------------------------------------------------- Anatomy  Cranium:               Previously seen        Aortic Arch:            Previously seen  Cavum:                 Previously seen        Ductal Arch:            Previously seen  Ventricles:            Previously seen        Diaphragm:              Previously seen  Choroid Plexus:        Previously seen        Stomach:                Appears normal, left                                                                        sided  Cerebellum:            Previously seen        Abdomen:                Appears normal  Posterior Fossa:       Previously seen        Abdominal Wall:         Previously seen  Nuchal Fold:           Previously seen        Cord Vessels:           Appears normal (3                                                                         vessel cord)  Face:                  Orbits and profile     Kidneys:                Appear normal                         previously seen  Lips:                  Previously seen        Bladder:                Appears normal  Thoracic:              Previously seen        Spine:                  Previously seen  Heart:                 Appears normal         Upper Extremities:      Previously seen                         (4CH, axis, and  situs)  RVOT:                  Previously seen        Lower Extremities:      Previously seen  LVOT:                  Previously seen  Other:  Female gender, heels and 5th digits, nasal bone and open hands          previously visualized. Technically difficult due to fetal position and          oligohydroamnios ---------------------------------------------------------------------- Cervix Uterus Adnexa  Cervix  Length:           3.34  cm.  Normal appearance by transabdominal scan. ---------------------------------------------------------------------- Impression  Normal interval growth.  Premature rupture membranes  AMA ---------------------------------------------------------------------- Recommendations  Continue inpatient management. ----------------------------------------------------------------------               Sander Nephew, MD Electronically Signed Final Report   12/10/2018 12:57 pm ----------------------------------------------------------------------   Medications:  Scheduled . aspirin  81 mg Oral Daily  . calcium carbonate  2 tablet Oral BID  . ferrous gluconate  324 mg Oral Q breakfast  . prenatal multivitamin  1 tablet Oral Q1200  . rho (d) immune globulin  300 mcg Intravenous Once   I have reviewed the patient's current medications.  ASSESSMENT: Patient Active Problem List   Diagnosis Date Noted  . Preterm premature rupture of membranes (PPROM) in second trimester, antepartum  12/09/2018  . Supervision of high-risk pregnancy 09/17/2018  . Rh negative status during pregnancy 09/17/2018  . AMA (advanced maternal age) primigravida 35+ 09/17/2018    PLAN: - Normal 2 hour glucola today - Will give Feraheme to help with anemia - Continue monitoring for si/sx of chorioamnionitis - Maternal- fetal status stable  Somaya Grassi 01/02/2019,9:05 AM

## 2019-01-03 DIAGNOSIS — O42112 Preterm premature rupture of membranes, onset of labor more than 24 hours following rupture, second trimester: Secondary | ICD-10-CM | POA: Diagnosis not present

## 2019-01-03 DIAGNOSIS — Z3A26 26 weeks gestation of pregnancy: Secondary | ICD-10-CM | POA: Diagnosis not present

## 2019-01-03 NOTE — Progress Notes (Signed)
Patient ID: Aleezay Melchert, female   DOB: 02/20/1981, 38 y.o.   MRN: [id number] [place]) NOTE  Eveleigh Espeland is a 38 y.o. G3P0020 at [redacted]w[redacted]d who is admitted for rupture of membranes.   Fetal presentation is cephalic. Length of Stay:  25  Days  Subjective: Patient reports feeling well this morning and is without complaints Patient reports the fetal movement as active. Patient reports uterine contraction  activity as none. Patient reports  vaginal bleeding as none. Patient describes fluid per vagina as Clear.  Vitals:  Blood pressure (!) 84/51, pulse 94, temperature (!) 97.3 F (36.3 C), temperature source Oral, resp. rate 18, height 5\' 2"  (1.575 m), weight 48.4 kg, last menstrual period 07/01/2018, SpO2 98 %. Physical Examination: GENERAL: Well-developed, well-nourished female in no acute distress.  LUNGS: Clear to auscultation bilaterally.  HEART: Regular rate and rhythm. ABDOMEN: Soft, nontender, gravid EXTREMITIES: No cyanosis, clubbing, or edema, 2+ distal pulses.   Fetal Monitoring: baseline 140, mod variability, + accels, no decels Toco: no contractions    Labs:  Results for orders placed or performed during the hospital encounter of 12/09/18 (from the past 24 hour(s))  CBC   Collection Time: 01/02/19 10:45 AM  Result Value Ref Range   WBC 5.2 4.0 - 10.5 K/uL   RBC 2.80 (L) 3.87 - 5.11 MIL/uL   Hemoglobin 9.7 (L) 12.0 - 15.0 g/dL   HCT 28.2 (L) 36.0 - 46.0 %   MCV 100.7 (H) 80.0 - 100.0 fL   MCH 34.6 (H) 26.0 - 34.0 pg   MCHC 34.4 30.0 - 36.0 g/dL   RDW 13.5 11.5 - 15.5 %   Platelets 227 150 - 400 K/uL   nRBC 0.0 0.0 - 0.2 %    [place] [facility]fm Ob Follow Up  Result Date: 12/31/2018 ----------------------------------------------------------------------  OBSTETRICS REPORT                       (Signed Final 12/31/2018 12:40 pm) ---------------------------------------------------------------------- Patient Info  ID #:       [id number]                           D.O.B.:  Jan 10, 1981 (38 yrs)  Name:       Hulan Saas Ganser                  Visit Date: 12/31/2018 09:08 am ---------------------------------------------------------------------- Performed By  Performed By:     Valda Favia          Ref. Address:     Faculty                    RDMS  Attending:        Tama High MD        [facility]  Referred By:      Mora Bellman MD ---------------------------------------------------------------------- Orders   #  Description  Code         Ordered By   1  [place] MFM OB FOLLOW UP                  [id number]     KELLY DAVIS  ----------------------------------------------------------------------   #  Order #                    Accession #                 Episode #   1  [id number]                  [id number]                  [id number]  ---------------------------------------------------------------------- Indications   [redacted] weeks gestation of pregnancy                Z3A.26   Encounter for other antenatal screening        Z36.2   follow-up   Premature rupture of membranes - leaking       O42.90   fluid   Advanced maternal age multigravida 19,         O39.522   second trimester   Oligohydraminios, third trimester, unspecified O41.03X0  ---------------------------------------------------------------------- Vital Signs                                                 Height:        5'2" ---------------------------------------------------------------------- Fetal Evaluation  Num Of Fetuses:         1  Fetal Heart Rate(bpm):  137  Cardiac Activity:       Observed  Presentation:           Cephalic  Placenta:               Anterior  P. Cord Insertion:      Previously Visualized  Amniotic Fluid  AFI FV:      Oligohydramnios  AFI Sum(cm)     %Tile       Largest Pocket(cm)  3.33            < 3         1.46  RUQ(cm)       RLQ(cm)       LUQ(cm)         LLQ(cm)  0.97          0.9           1.46           0 ---------------------------------------------------------------------- Biometry  BPD:      65.8  mm     G. Age:  26w 4d         54  %    CI:        75.57   %    70 - 86                                                          FL/HC:      19.0   %    18.6 - 20.4  HC:  240   mm     G. Age:  26w 0d         23  %    HC/AC:      1.15        1.04 - 1.22  AC:      208.8  mm     G. Age:  25w 3d         21  %    FL/BPD:     69.3   %    71 - 87  FL:       45.6  mm     G. Age:  25w 1d         11  %    FL/AC:      21.8   %    20 - 24  HUM:      41.3  mm     G. Age:  25w 0d         16  %  Est. FW:     810  gm    1 lb 13 oz      15  % ---------------------------------------------------------------------- OB History  Gravidity:    3          SAB:   2 ---------------------------------------------------------------------- Gestational Age  LMP:           26w 1d        Date:  07/01/18                 EDD:   04/07/19  U/S Today:     25w 6d                                        EDD:   04/09/19  Best:          26w 1d     Det. By:  LMP  (07/01/18)          EDD:   04/07/19 ---------------------------------------------------------------------- Anatomy  Cranium:               Previously seen        Aortic Arch:            Previously seen  Cavum:                 Previously seen        Ductal Arch:            Previously seen  Ventricles:            Previously seen        Diaphragm:              Previously seen  Choroid Plexus:        Previously seen        Stomach:                Appears normal, left                                                                        sided  Cerebellum:            Previously  seen        Abdomen:                Appears normal  Posterior Fossa:       Previously seen        Abdominal Wall:         Previously seen  Nuchal Fold:           Previously seen        Cord Vessels:           Appears normal (3                                                                         vessel cord)  Face:                  Orbits and profile     Kidneys:                Appear normal                         previously seen  Lips:                  Previously seen        Bladder:                Appears normal  Thoracic:              Previously seen        Spine:                  Previously seen  Heart:                 Appears normal         Upper Extremities:      Previously seen                         (4CH, axis, and                         situs)  RVOT:                  Previously seen        Lower Extremities:      Previously seen  LVOT:                  Appears normal  Other:  Female gender, heels and 5th digits, nasal bone and open hands          previously visualized. Technically difficult due to fetal position and          oligohydroamnios ---------------------------------------------------------------------- Cervix Uterus Adnexa  Cervix  Not visualized (advanced GA >24wks)  Uterus  No abnormality visualized.  Left Ovary  No adnexal mass visualized.  Right Ovary  No adnexal mass visualized.  Cul De Sac  No free fluid seen.  Adnexa  No abnormality visualized. ---------------------------------------------------------------------- Impression  Patient is admitted with PPROM.  Fetal growth is appropriate for gestational age. Cephalic  presentation. Oligohydramnios (consistent with PPROM) is  seen again. ---------------------------------------------------------------------- Recommendations  -Follow-up limited ultrasound next week. ----------------------------------------------------------------------  Tama High, MD Electronically Signed Final Report   12/31/2018 12:40 pm ----------------------------------------------------------------------  [place] Mfm Ob Follow Up  Result Date: 12/10/2018 ----------------------------------------------------------------------  OBSTETRICS REPORT                       (Signed Final 12/10/2018 12:57 pm)  ---------------------------------------------------------------------- Patient Info  ID #:       [id number]                          D.O.B.:  10/25/1980 (38 yrs)  Name:       Hulan Saas [place]                  Visit Date: 12/09/2018 05:46 pm ---------------------------------------------------------------------- Performed By  Performed By:     Wilnette Kales        Ref. Address:      Faculty                    RDMS,RVT  Attending:        Sander Nephew      [facility]                                        [place]  Referred By:      Mora Bellman MD ---------------------------------------------------------------------- Orders   #  Description                          Code         Ordered By   1  [place] MFM OB FOLLOW UP                  [id number]     CHARLIE PICKENS  ----------------------------------------------------------------------   #  Order #                    Accession #                 Episode #   1  [id number]                  [id number]                  [id number]  ---------------------------------------------------------------------- Indications   Premature rupture of membranes - leaking       O42.90   fluid   Advanced maternal age multigravida 38,         O75.522   second trimester   [redacted] weeks gestation of pregnancy                Z3A.23  ---------------------------------------------------------------------- Vital Signs                                                 Height:        5'2" ---------------------------------------------------------------------- Fetal Evaluation  Num Of Fetuses:  1  Fetal Heart Rate(bpm):   132  Cardiac Activity:        Observed  Presentation:            Cephalic  Placenta:                Anterior  P. Cord Insertion:       Visualized, central  Amniotic Fluid  AFI FV:      Oligohydramnios ---------------------------------------------------------------------- Biometry  BPD:      57.5  mm     G. Age:  23w 4d          68  %    CI:        75.15   %    70 - 86                                                          FL/HC:       18.8  %    19.2 - 20.8  HC:      210.4  mm     G. Age:  23w 1d         40  %    HC/AC:       1.20       1.05 - 1.21  AC:      175.4  mm     G. Age:  22w 3d         25  %    FL/BPD:      68.7  %    71 - 87  FL:       39.5  mm     G. Age:  22w 5d         29  %    FL/AC:       22.5  %    20 - 24  Est. FW:     524   gm     1 lb 2 oz     27  % ---------------------------------------------------------------------- OB History  Gravidity:    3          SAB:   2 ---------------------------------------------------------------------- Gestational Age  LMP:           23w 0d        Date:  07/01/18                 EDD:   04/07/19  U/S Today:     23w 0d                                        EDD:   04/07/19  Best:          23w 0d     Det. By:  LMP  (07/01/18)          EDD:   04/07/19 ---------------------------------------------------------------------- Anatomy  Cranium:               Previously seen        Aortic Arch:            Previously seen  Cavum:                 Previously seen  Ductal Arch:            Previously seen  Ventricles:            Previously seen        Diaphragm:              Previously seen  Choroid Plexus:        Previously seen        Stomach:                Appears normal, left                                                                        sided  Cerebellum:            Previously seen        Abdomen:                Appears normal  Posterior Fossa:       Previously seen        Abdominal Wall:         Previously seen  Nuchal Fold:           Previously seen        Cord Vessels:           Appears normal (3                                                                        vessel cord)  Face:                  Orbits and profile     Kidneys:                Appear normal                         previously seen  Lips:                  Previously seen        Bladder:                 Appears normal  Thoracic:              Previously seen        Spine:                  Previously seen  Heart:                 Appears normal         Upper Extremities:      Previously seen                         (4CH, axis, and                         situs)  RVOT:  Previously seen        Lower Extremities:      Previously seen  LVOT:                  Previously seen  Other:  Female gender, heels and 5th digits, nasal bone and open hands          previously visualized. Technically difficult due to fetal position and          oligohydroamnios ---------------------------------------------------------------------- Cervix Uterus Adnexa  Cervix  Length:           3.34  cm.  Normal appearance by transabdominal scan. ---------------------------------------------------------------------- Impression  Normal interval growth.  Premature rupture membranes  AMA ---------------------------------------------------------------------- Recommendations  Continue inpatient management. ----------------------------------------------------------------------               Sander Nephew, MD Electronically Signed Final Report   12/10/2018 12:57 pm ----------------------------------------------------------------------   Medications:  Scheduled . aspirin  81 mg Oral Daily  . calcium carbonate  2 tablet Oral BID  . ferrous gluconate  324 mg Oral Q breakfast  . prenatal multivitamin  1 tablet Oral Q1200  . rho (d) immune globulin  300 mcg Intravenous Once   I have reviewed the patient's current medications.  ASSESSMENT: Patient Active Problem List   Diagnosis Date Noted  . Preterm premature rupture of membranes (PPROM) in second trimester, antepartum 12/09/2018  . Supervision of high-risk pregnancy 09/17/2018  . Rh negative status during pregnancy 09/17/2018  . AMA (advanced maternal age) primigravida 35+ 09/17/2018    PLAN: - s/p Feraheme for anemia. Will repeat Feraheme next wednesday - Continue  monitoring for si/sx of chorioamnionitis - Maternal- fetal status stable  Jomar Denz 01/03/2019,10:23 AM

## 2019-01-04 DIAGNOSIS — O99019 Anemia complicating pregnancy, unspecified trimester: Secondary | ICD-10-CM | POA: Diagnosis present

## 2019-01-04 DIAGNOSIS — Z3A26 26 weeks gestation of pregnancy: Secondary | ICD-10-CM | POA: Diagnosis not present

## 2019-01-04 DIAGNOSIS — O42112 Preterm premature rupture of membranes, onset of labor more than 24 hours following rupture, second trimester: Secondary | ICD-10-CM | POA: Diagnosis not present

## 2019-01-04 MED ORDER — SENNOSIDES-DOCUSATE SODIUM 8.6-50 MG PO TABS
1.0000 | ORAL_TABLET | Freq: Two times a day (BID) | ORAL | Status: DC
  Administered 2019-01-04 – 2019-01-11 (×15): 1 via ORAL
  Filled 2019-01-04 (×15): qty 1

## 2019-01-04 NOTE — Progress Notes (Signed)
Patient ID: Maria Reed, female   DOB: 12-08-1980, 38 y.o.   MRN: [id number] [facility]) NOTE  Maria Reed is a 38 y.o. G3P0020 at [redacted]w[redacted]d who is admitted for PPROM.   Fetal presentation is cephalic. Length of Stay:  26  Days  Subjective: Reports having constipation. No other complaints. Patient reports the fetal movement as active. Patient reports uterine contraction  activity as none. Patient reports  vaginal bleeding as none. Patient describes fluid per vagina as Clear.  Vitals:  Blood pressure (!) 89/52, pulse (!) 106, temperature 97.8 F (36.6 C), temperature source Oral, resp. rate 18, height 5\' 2"  (1.575 m), weight 48.4 kg, last menstrual period 07/01/2018, SpO2 98 %. Physical Examination: GENERAL: Well-developed, well-nourished female in no acute distress.  LUNGS: Clear to auscultation bilaterally.  HEART: Regular rate and rhythm. ABDOMEN: Soft, nontender, gravid EXTREMITIES: No cyanosis, clubbing, or edema, 2+ distal pulses.  Fetal Monitoring: baseline 140, mod variability, + accels, no decels Toco: no contractions  Labs:  No results found for this or any previous visit (from the past 24 hour(s)).  [place] Mfm Ob Follow Up  Result Date: 12/31/2018 ----------------------------------------------------------------------  OBSTETRICS REPORT                       (Signed Final 12/31/2018 12:40 pm) ---------------------------------------------------------------------- Patient Info  ID #:       [id number]                          D.O.B.:  Jul 30, 1980 (38 yrs)  Name:       Maria Reed                  Visit Date: 12/31/2018 09:08 am ---------------------------------------------------------------------- Performed By  Performed By:     Valda Favia          Ref. Address:     Faculty                    RDMS  Attending:        Tama High MD        [facility]   Referred By:      Vickii Chafe                    CONSTANT MD ---------------------------------------------------------------------- Orders   #  Description                          Code         Ordered By   1  [place] MFM OB FOLLOW UP                  [id number]     KELLY DAVIS  ----------------------------------------------------------------------   #  Order #                    Accession #                 Episode #   1  [id number]                  [id number]                  [id number]  ---------------------------------------------------------------------- Indications   [redacted] weeks gestation of pregnancy                Z3A.26   Encounter for other antenatal screening        Z36.2   follow-up   Premature rupture of membranes - leaking       O42.90   fluid   Advanced maternal age multigravida 67,         O85.522   second trimester   Oligohydraminios, third trimester, unspecified O41.03X0  ---------------------------------------------------------------------- Vital Signs                                                 Height:        5'2" ---------------------------------------------------------------------- Fetal Evaluation  Num Of Fetuses:         1  Fetal Heart Rate(bpm):  137  Cardiac Activity:       Observed  Presentation:           Cephalic  Placenta:               Anterior  P. Cord Insertion:      Previously Visualized  Amniotic Fluid  AFI FV:      Oligohydramnios  AFI Sum(cm)     %Tile       Largest Pocket(cm)  3.33            < 3         1.46  RUQ(cm)       RLQ(cm)       LUQ(cm)        LLQ(cm)  0.97          0.9           1.46           0 ---------------------------------------------------------------------- Biometry  BPD:      65.8  mm     G. Age:  26w 4d         54  %    CI:        75.57   %    70 - 86                                                          FL/HC:      19.0   %    18.6 - 20.4  HC:       240   mm     G. Age:  26w 0d         23  %    HC/AC:      1.15        1.04 - 1.22  AC:      208.8  mm     G. Age:  25w 3d          21  %    FL/BPD:     69.3   %    71 - 87  FL:  45.6  mm     G. Age:  25w 1d         11  %    FL/AC:      21.8   %    20 - 24  HUM:      41.3  mm     G. Age:  25w 0d         16  %  Est. FW:     810  gm    1 lb 13 oz      15  % ---------------------------------------------------------------------- OB History  Gravidity:    3          SAB:   2 ---------------------------------------------------------------------- Gestational Age  LMP:           26w 1d        Date:  07/01/18                 EDD:   04/07/19  U/S Today:     25w 6d                                        EDD:   04/09/19  Best:          26w 1d     Det. By:  LMP  (07/01/18)          EDD:   04/07/19 ---------------------------------------------------------------------- Anatomy  Cranium:               Previously seen        Aortic Arch:            Previously seen  Cavum:                 Previously seen        Ductal Arch:            Previously seen  Ventricles:            Previously seen        Diaphragm:              Previously seen  Choroid Plexus:        Previously seen        Stomach:                Appears normal, left                                                                        sided  Cerebellum:            Previously seen        Abdomen:                Appears normal  Posterior Fossa:       Previously seen        Abdominal Wall:         Previously seen  Nuchal Fold:           Previously seen        Cord Vessels:           Appears normal (3  vessel cord)  Face:                  Orbits and profile     Kidneys:                Appear normal                         previously seen  Lips:                  Previously seen        Bladder:                Appears normal  Thoracic:              Previously seen        Spine:                  Previously seen  Heart:                 Appears normal         Upper Extremities:      Previously seen                         (4CH,  axis, and                         situs)  RVOT:                  Previously seen        Lower Extremities:      Previously seen  LVOT:                  Appears normal  Other:  Female gender, heels and 5th digits, nasal bone and open hands          previously visualized. Technically difficult due to fetal position and          oligohydroamnios ---------------------------------------------------------------------- Cervix Uterus Adnexa  Cervix  Not visualized (advanced GA >24wks)  Uterus  No abnormality visualized.  Left Ovary  No adnexal mass visualized.  Right Ovary  No adnexal mass visualized.  Cul De Sac  No free fluid seen.  Adnexa  No abnormality visualized. ---------------------------------------------------------------------- Impression  Patient is admitted with PPROM.  Fetal growth is appropriate for gestational age. Cephalic  presentation. Oligohydramnios (consistent with PPROM) is  seen again. ---------------------------------------------------------------------- Recommendations  -Follow-up limited ultrasound next week. ----------------------------------------------------------------------                  Tama High, MD Electronically Signed Final Report   12/31/2018 12:40 pm ----------------------------------------------------------------------  [place] Mfm Ob Follow Up  Result Date: 12/10/2018 ----------------------------------------------------------------------  OBSTETRICS REPORT                       (Signed Final 12/10/2018 12:57 pm) ---------------------------------------------------------------------- Patient Info  ID #:       [id number]                          D.O.B.:  03-24-1980 (38 yrs)  Name:       Maria Saas Tensley                  Visit Date: 12/09/2018 05:46 pm ---------------------------------------------------------------------- Performed By  Performed By:     Wilnette Kales  Ref. Address:      Faculty                    RDMS,RVT  Attending:        Sander Nephew      [facility]                                        [facility]  Referred By:      Mora Bellman MD ---------------------------------------------------------------------- Orders   #  Description                          Code         Ordered By   1  [place] MFM OB FOLLOW UP                  [id number]     CHARLIE PICKENS  ----------------------------------------------------------------------   #  Order #                    Accession #                 Episode #   1  [id number]                  [id number]                  [id number]  ---------------------------------------------------------------------- Indications   Premature rupture of membranes - leaking       O42.90   fluid   Advanced maternal age multigravida 75,         O74.522   second trimester   [redacted] weeks gestation of pregnancy                Z3A.23  ---------------------------------------------------------------------- Vital Signs                                                 Height:        5'2" ---------------------------------------------------------------------- Fetal Evaluation  Num Of Fetuses:          1  Fetal Heart Rate(bpm):   132  Cardiac Activity:        Observed  Presentation:            Cephalic  Placenta:                Anterior  P. Cord Insertion:       Visualized, central  Amniotic Fluid  AFI FV:      Oligohydramnios ---------------------------------------------------------------------- Biometry  BPD:      57.5  mm     G. Age:  23w 4d         68  %    CI:        75.15   %    70 - 86  FL/HC:       18.8  %    19.2 - 20.8  HC:      210.4  mm     G. Age:  23w 1d         40  %    HC/AC:       1.20       1.05 - 1.21  AC:      175.4  mm     G. Age:  22w 3d         25  %    FL/BPD:      68.7  %    71 - 87  FL:       39.5  mm     G. Age:  22w 5d         29  %    FL/AC:       22.5  %    20 - 24  Est. FW:     524   gm     1 lb 2 oz     27  %  ---------------------------------------------------------------------- OB History  Gravidity:    3          SAB:   2 ---------------------------------------------------------------------- Gestational Age  LMP:           23w 0d        Date:  07/01/18                 EDD:   04/07/19  U/S Today:     23w 0d                                        EDD:   04/07/19  Best:          23w 0d     Det. By:  LMP  (07/01/18)          EDD:   04/07/19 ---------------------------------------------------------------------- Anatomy  Cranium:               Previously seen        Aortic Arch:            Previously seen  Cavum:                 Previously seen        Ductal Arch:            Previously seen  Ventricles:            Previously seen        Diaphragm:              Previously seen  Choroid Plexus:        Previously seen        Stomach:                Appears normal, left                                                                        sided  Cerebellum:            Previously seen  Abdomen:                Appears normal  Posterior Fossa:       Previously seen        Abdominal Wall:         Previously seen  Nuchal Fold:           Previously seen        Cord Vessels:           Appears normal (3                                                                        vessel cord)  Face:                  Orbits and profile     Kidneys:                Appear normal                         previously seen  Lips:                  Previously seen        Bladder:                Appears normal  Thoracic:              Previously seen        Spine:                  Previously seen  Heart:                 Appears normal         Upper Extremities:      Previously seen                         (4CH, axis, and                         situs)  RVOT:                  Previously seen        Lower Extremities:      Previously seen  LVOT:                  Previously seen  Other:  Female gender, heels and 5th digits, nasal bone and open hands           previously visualized. Technically difficult due to fetal position and          oligohydroamnios ---------------------------------------------------------------------- Cervix Uterus Adnexa  Cervix  Length:           3.34  cm.  Normal appearance by transabdominal scan. ---------------------------------------------------------------------- Impression  Normal interval growth.  Premature rupture membranes  AMA ---------------------------------------------------------------------- Recommendations  Continue inpatient management. ----------------------------------------------------------------------               Sander Nephew, MD Electronically Signed Final Report   12/10/2018 12:57 pm ----------------------------------------------------------------------   Medications:  Scheduled . aspirin  81 mg Oral Daily  . calcium carbonate  2 tablet  Oral BID  . ferrous gluconate  324 mg Oral Q breakfast  . prenatal multivitamin  1 tablet Oral Q1200  . rho (d) immune globulin  300 mcg Intravenous Once  . senna-docusate  1 tablet Oral BID   I have reviewed the patient's current medications.  ASSESSMENT: Patient Active Problem List   Diagnosis Date Noted  . Anemia in pregnancy 01/04/2019  . Preterm premature rupture of membranes (PPROM) in second trimester, antepartum 12/09/2018  . Supervision of high-risk pregnancy 09/17/2018  . Rh negative status during pregnancy 09/17/2018  . AMA (advanced maternal age) primigravida 35+ 09/17/2018    PLAN: - [place] ordered bid for constipation - s/p Feraheme for anemia. Will repeat Feraheme next wednesday - Continue monitoring for si/sx of chorioamnionitis - Maternal- fetal status stable  Verita Schneiders, MD 01/04/2019,7:37 AM

## 2019-01-05 DIAGNOSIS — O42112 Preterm premature rupture of membranes, onset of labor more than 24 hours following rupture, second trimester: Secondary | ICD-10-CM | POA: Diagnosis not present

## 2019-01-05 LAB — CBC
HCT: 26.1 % — ABNORMAL LOW (ref 36.0–46.0)
Hemoglobin: 8.9 g/dL — ABNORMAL LOW (ref 12.0–15.0)
MCH: 34.5 pg — ABNORMAL HIGH (ref 26.0–34.0)
MCHC: 34.1 g/dL (ref 30.0–36.0)
MCV: 101.2 fL — ABNORMAL HIGH (ref 80.0–100.0)
Platelets: 285 10*3/uL (ref 150–400)
RBC: 2.58 MIL/uL — ABNORMAL LOW (ref 3.87–5.11)
RDW: 13.8 % (ref 11.5–15.5)
WBC: 6.2 10*3/uL (ref 4.0–10.5)
nRBC: 0 % (ref 0.0–0.2)

## 2019-01-05 LAB — TYPE AND SCREEN
ABO/RH(D): A NEG
Antibody Screen: NEGATIVE

## 2019-01-05 NOTE — Progress Notes (Signed)
[facility] PROGRESS NOTE  Joelene Giel is a 38 y.o. G3P0020 at [redacted]w[redacted]d who is admitted for PROM.  Estimated Date of Delivery: 04/07/19 Fetal presentation is cephalic.  Length of Stay:  27 Days. Admitted 12/09/2018  Subjective:  Patient reports normal fetal movement.  She denies uterine contractions, denies bleeding, states she has leaking only if she turns on her left side. No other complaints.  Vitals:  Blood pressure (!) 102/59, pulse 97, temperature 98.2 F (36.8 C), temperature source Oral, resp. rate 16, height 5\' 2"  (1.575 m), weight 48.4 kg, last menstrual period 07/01/2018, SpO2 97 %. Physical Examination: CONSTITUTIONAL: Well-developed, well-nourished female in no acute distress.  HENT:  Normocephalic, atraumatic, External right and left ear normal. Oropharynx is clear and moist EYES: Conjunctivae and EOM are normal. Pupils are equal, round, and reactive to light. No scleral icterus.  NECK: Normal range of motion, supple, no masses. SKIN: Skin is warm and dry. No rash noted. Not diaphoretic. No erythema. No pallor. [place]: Alert and oriented to person, place, and time. Normal reflexes, muscle tone coordination. No cranial nerve deficit noted. PSYCHIATRIC: Normal mood and affect. Normal behavior. Normal judgment and thought content. CARDIOVASCULAR: Normal heart rate noted RESPIRATORY: Effort normal, no problems with respiration noted MUSCULOSKELETAL: Normal range of motion. No edema and no tenderness. ABDOMEN: Soft, nontender, nondistended, gravid. CERVIX: deferred  Fetal monitoring: FHR: 145 bpm, Variability: moderate, Accelerations: Present, Decelerations: two variables  Uterine activity: no contractions per hour  No results found for this or any previous visit (from the past 48 hour(s)).  I have reviewed the patient's current medications.  ASSESSMENT: Principal Problem:   Preterm premature rupture of membranes (PPROM) in second trimester,  antepartum Active Problems:   Supervision of high-risk pregnancy   Rh negative status during pregnancy   AMA (advanced maternal age) primigravida 35+   Anemia in pregnancy   PLAN: S/p BTMZ S/p latency abx S/p NICU consult Maternal-fetal status stable Cont inpatient management   Continue routine antenatal care.   Feliz Beam, M.D. [facility]ending [facility] (Faculty Practice)  01/05/2019 7:31 AM

## 2019-01-06 DIAGNOSIS — Z3A27 27 weeks gestation of pregnancy: Secondary | ICD-10-CM

## 2019-01-06 MED ORDER — SODIUM CHLORIDE 0.9% FLUSH: 10.0000 mL | Freq: Two times a day (BID) | INTRAVENOUS | Status: DC

## 2019-01-06 MED ORDER — SODIUM CHLORIDE 0.9% FLUSH
10.0000 mL | Freq: Two times a day (BID) | INTRAVENOUS | Status: DC
  Administered 2019-01-06 – 2019-01-11 (×10): 10 mL via INTRAVENOUS

## 2019-01-06 NOTE — Progress Notes (Signed)
Pt's midline due to be changed. Call to IV Team, they will send supplies for dressing change. Verified with IV Team RN Lauren that Midline can be saline locked and flushed with 13mL of NS q12 for patency. Dr. Hulan Fray called. IVF cont. Orders d/c and order for flush placed.

## 2019-01-06 NOTE — Plan of Care (Signed)
  Problem: Clinical Measurements: Goal: Complications related to the disease process, condition or treatment will be avoided or minimized Outcome: Progressing  Pt. Endorses good fetal movement, no complaints of pain. Will continue with POC.

## 2019-01-06 NOTE — Progress Notes (Signed)
Patient ID: Maria Reed, female   DOB: 05-07-1980, 38 y.o.   MRN: [id number] [facility] COMPREHENSIVE PROGRESS NOTE  Maria Reed is a 38 y.o. G3P0020 at [redacted]w[redacted]d  who is admitted for PROM.   Fetal presentation is cephalic. U/S 12/31/18 Length of Stay:  28  Days  Subjective: Pt reports leg cramps at times.  Patient reports good fetal movement.  She reports no uterine contractions, no bleeding. Still occ LOF .  Vitals:  Blood pressure (!) 104/41, pulse 93, temperature 97.8 F (36.6 C), temperature source Oral, resp. rate 15, height 5\' 2"  (1.575 m), weight 48.4 kg, last menstrual period 07/01/2018, SpO2 98 %.   Physical Examination: Lungs clear Heart RRR Abd soft + BS gravid non tedner Ext non tedner  Fetal Monitoring:  120-130's, + accels, no decels, no ut ctx  Labs:  Results for orders placed or performed during the hospital encounter of 12/09/18 (from the past 24 hour(s))  CBC   Collection Time: 01/05/19 10:55 AM  Result Value Ref Range   WBC 6.2 4.0 - 10.5 K/uL   RBC 2.58 (L) 3.87 - 5.11 MIL/uL   Hemoglobin 8.9 (L) 12.0 - 15.0 g/dL   HCT 26.1 (L) 36.0 - 46.0 %   MCV 101.2 (H) 80.0 - 100.0 fL   MCH 34.5 (H) 26.0 - 34.0 pg   MCHC 34.1 30.0 - 36.0 g/dL   RDW 13.8 11.5 - 15.5 %   Platelets 285 150 - 400 K/uL   nRBC 0.0 0.0 - 0.2 %  Type and screen [place]   Collection Time: 01/05/19 10:55 AM  Result Value Ref Range   ABO/RH(D) A NEG    Antibody Screen NEG    Sample Expiration      01/08/2019,2359 Performed at [facility], [place] [address], [place], [place] [address]     Imaging Studies:    U/S 12/31/18   Medications:  Scheduled . aspirin  81 mg Oral Daily  . calcium carbonate  2 tablet Oral BID  . ferrous gluconate  324 mg Oral Q breakfast  . prenatal multivitamin  1 tablet Oral Q1200  . rho (d) immune globulin  300 mcg Intravenous Once  . senna-docusate  1 tablet Oral BID   I have reviewed the patient's current  medications.  ASSESSMENT: IUP 27 0/7 weeks PROM Anemia  PLAN: Stable. No S/Sx of infection, Delivery at 34 weeks or for maternal/fetal indications Continue routine antenatal care.   Chancy Milroy 01/06/2019,7:09 AM

## 2019-01-07 NOTE — Progress Notes (Signed)
Maria Reed is a 38 y.o. G3P0020 at [redacted]w[redacted]d  who is admitted for PROM.   Fetal presentation is cephalic. U/S 12/31/18 Length of Stay:  29  Days   S. She denies any problems, is having normal BMs and is voiding. She reports good FM, denies bleeding or contractions.  O. VSS, AF FHR- reassuring Abd- benign, gravid, no tenderness  A/P. PPROM at 27 weeks, now s/p BMZ and latency abx. No s/sx of chorio Plan for delivery at 34 weeks or prn sooner

## 2019-01-08 LAB — CBC
HCT: 26 % — ABNORMAL LOW (ref 36.0–46.0)
Hemoglobin: 8.9 g/dL — ABNORMAL LOW (ref 12.0–15.0)
MCH: 34.8 pg — ABNORMAL HIGH (ref 26.0–34.0)
MCHC: 34.2 g/dL (ref 30.0–36.0)
MCV: 101.6 fL — ABNORMAL HIGH (ref 80.0–100.0)
Platelets: 306 10*3/uL (ref 150–400)
RBC: 2.56 MIL/uL — ABNORMAL LOW (ref 3.87–5.11)
RDW: 13.8 % (ref 11.5–15.5)
WBC: 8 10*3/uL (ref 4.0–10.5)
nRBC: 0 % (ref 0.0–0.2)

## 2019-01-08 LAB — TYPE AND SCREEN
ABO/RH(D): A NEG
Antibody Screen: NEGATIVE

## 2019-01-08 NOTE — Progress Notes (Signed)
Patient ID: Maria Reed, female   DOB: [birth year], 38 y.o.   MRN: [id number] [place]) NOTE  Maria Reed is a 38 y.o. G3P0020 at [redacted]w[redacted]d who is admitted for PPROM.   Fetal presentation is cephalic. Length of Stay:  30  Days  Subjective: No complaints reported today. Patient reports the fetal movement as active. Patient reports uterine contraction  activity as none. Patient reports  vaginal bleeding as none. Patient describes fluid per vagina as Clear.  Vitals:  Blood pressure (!) 96/56, pulse 88, temperature 97.8 F (36.6 C), temperature source Oral, resp. rate 18, height 5\' 2"  (1.575 m), weight 48.4 kg, last menstrual period 07/01/2018, SpO2 100 %. Physical Examination: GENERAL: Well-developed, well-nourished female in no acute distress.  LUNGS: Clear to auscultation bilaterally.  HEART: Regular rate and rhythm. ABDOMEN: Soft, nontender, gravid EXTREMITIES: No cyanosis, clubbing, or edema, 2+ distal pulses.  Fetal Monitoring: baseline 140, mod variability, + accels, no decels Toco: no contractions  Labs:  No results found for this or any previous visit (from the past 24 hour(s)).  [place] Mfm Ob Follow Up  Result Date: 12/31/2018 ----------------------------------------------------------------------  OBSTETRICS REPORT                       (Signed Final 12/31/2018 12:40 pm) ---------------------------------------------------------------------- Patient Info  ID #:       [id number]                          D.O.B.:  [birth year] (38 yrs)  Name:       Maria Reed                  Visit Date: 12/31/2018 09:08 am ---------------------------------------------------------------------- Performed By  Performed By:     Valda Favia          Ref. Address:     Faculty                    RDMS  Attending:        Tama High MD        [facility]  Referred By:      Mora Bellman MD ---------------------------------------------------------------------- Orders   #  Description                          Code         Ordered By   1  [place] MFM OB FOLLOW UP                  [id number]     KELLY DAVIS  ----------------------------------------------------------------------   #  Order #                    Accession #                 Episode #   1  [id number]  [id number]                  [id number]  ---------------------------------------------------------------------- Indications   [redacted] weeks gestation of pregnancy                Z3A.26   Encounter for other antenatal screening        Z36.2   follow-up   Premature rupture of membranes - leaking       O42.90   fluid   Advanced maternal age multigravida 70,         O47.522   second trimester   Oligohydraminios, third trimester, unspecified O41.03X0  ---------------------------------------------------------------------- Vital Signs                                                 Height:        5'2" ---------------------------------------------------------------------- Fetal Evaluation  Num Of Fetuses:         1  Fetal Heart Rate(bpm):  137  Cardiac Activity:       Observed  Presentation:           Cephalic  Placenta:               Anterior  P. Cord Insertion:      Previously Visualized  Amniotic Fluid  AFI FV:      Oligohydramnios  AFI Sum(cm)     %Tile       Largest Pocket(cm)  3.33            < 3         1.46  RUQ(cm)       RLQ(cm)       LUQ(cm)        LLQ(cm)  0.97          0.9           1.46           0 ---------------------------------------------------------------------- Biometry  BPD:      65.8  mm     G. Age:  26w 4d         54  %    CI:        75.57   %    70 - 86                                                          FL/HC:      19.0   %    18.6 - 20.4  HC:       240   mm     G. Age:  26w 0d         23  %    HC/AC:      1.15        1.04 - 1.22  AC:      208.8  mm     G. Age:  25w 3d         21  %    FL/BPD:      69.3   %    71 - 87  FL:       45.6  mm     G. Age:  25w 1d  11  %    FL/AC:      21.8   %    20 - 24  HUM:      41.3  mm     G. Age:  25w 0d         16  %  Est. FW:     810  gm    1 lb 13 oz      15  % ---------------------------------------------------------------------- OB History  Gravidity:    3          SAB:   2 ---------------------------------------------------------------------- Gestational Age  LMP:           26w 1d        Date:  07/01/18                 EDD:   04/07/19  U/S Today:     25w 6d                                        EDD:   04/09/19  Best:          26w 1d     Det. By:  LMP  (07/01/18)          EDD:   04/07/19 ---------------------------------------------------------------------- Anatomy  Cranium:               Previously seen        Aortic Arch:            Previously seen  Cavum:                 Previously seen        Ductal Arch:            Previously seen  Ventricles:            Previously seen        Diaphragm:              Previously seen  Choroid Plexus:        Previously seen        Stomach:                Appears normal, left                                                                        sided  Cerebellum:            Previously seen        Abdomen:                Appears normal  Posterior Fossa:       Previously seen        Abdominal Wall:         Previously seen  Nuchal Fold:           Previously seen        Cord Vessels:           Appears normal (3  vessel cord)  Face:                  Orbits and profile     Kidneys:                Appear normal                         previously seen  Lips:                  Previously seen        Bladder:                Appears normal  Thoracic:              Previously seen        Spine:                  Previously seen  Heart:                 Appears normal         Upper Extremities:      Previously seen                         (4CH, axis, and                          situs)  RVOT:                  Previously seen        Lower Extremities:      Previously seen  LVOT:                  Appears normal  Other:  Female gender, heels and 5th digits, nasal bone and open hands          previously visualized. Technically difficult due to fetal position and          oligohydroamnios ---------------------------------------------------------------------- Cervix Uterus Adnexa  Cervix  Not visualized (advanced GA >24wks)  Uterus  No abnormality visualized.  Left Ovary  No adnexal mass visualized.  Right Ovary  No adnexal mass visualized.  Cul De Sac  No free fluid seen.  Adnexa  No abnormality visualized. ---------------------------------------------------------------------- Impression  Patient is admitted with PPROM.  Fetal growth is appropriate for gestational age. Cephalic  presentation. Oligohydramnios (consistent with PPROM) is  seen again. ---------------------------------------------------------------------- Recommendations  -Follow-up limited ultrasound next week. ----------------------------------------------------------------------                  Tama High, MD Electronically Signed Final Report   12/31/2018 12:40 pm ----------------------------------------------------------------------  [place] Mfm Ob Follow Up  Result Date: 12/10/2018 ----------------------------------------------------------------------  OBSTETRICS REPORT                       (Signed Final 12/10/2018 12:57 pm) ---------------------------------------------------------------------- Patient Info  ID #:       [id number]                          D.O.B.:  [birth year] (38 yrs)  Name:       Maria Reed                  Visit Date: 12/09/2018 05:46 pm ---------------------------------------------------------------------- Performed By  Performed By:     Wilnette Kales  Ref. Address:      Faculty                    RDMS,RVT  Attending:        Sander Nephew      [facility]                                        [place]  Referred By:      Mora Bellman MD ---------------------------------------------------------------------- Orders   #  Description                          Code         Ordered By   1  [place] MFM OB FOLLOW UP                  [id number]     CHARLIE PICKENS  ----------------------------------------------------------------------   #  Order #                    Accession #                 Episode #   1  [id number]                  [id number]                  [id number]  ---------------------------------------------------------------------- Indications   Premature rupture of membranes - leaking       O42.90   fluid   Advanced maternal age multigravida 35,         O69.522   second trimester   [redacted] weeks gestation of pregnancy                Z3A.23  ---------------------------------------------------------------------- Vital Signs                                                 Height:        5'2" ---------------------------------------------------------------------- Fetal Evaluation  Num Of Fetuses:          1  Fetal Heart Rate(bpm):   132  Cardiac Activity:        Observed  Presentation:            Cephalic  Placenta:                Anterior  P. Cord Insertion:       Visualized, central  Amniotic Fluid  AFI FV:      Oligohydramnios ---------------------------------------------------------------------- Biometry  BPD:      57.5  mm     G. Age:  23w 4d         68  %    CI:        75.15   %    70 - 86  FL/HC:       18.8  %    19.2 - 20.8  HC:      210.4  mm     G. Age:  23w 1d         40  %    HC/AC:       1.20       1.05 - 1.21  AC:      175.4  mm     G. Age:  22w 3d         25  %    FL/BPD:      68.7  %    71 - 87  FL:       39.5  mm     G. Age:  22w 5d         29  %    FL/AC:       22.5  %    20 - 24  Est. FW:     524   gm     1 lb 2 oz     27  %  ---------------------------------------------------------------------- OB History  Gravidity:    3          SAB:   2 ---------------------------------------------------------------------- Gestational Age  LMP:           23w 0d        Date:  07/01/18                 EDD:   04/07/19  U/S Today:     23w 0d                                        EDD:   04/07/19  Best:          23w 0d     Det. By:  LMP  (07/01/18)          EDD:   04/07/19 ---------------------------------------------------------------------- Anatomy  Cranium:               Previously seen        Aortic Arch:            Previously seen  Cavum:                 Previously seen        Ductal Arch:            Previously seen  Ventricles:            Previously seen        Diaphragm:              Previously seen  Choroid Plexus:        Previously seen        Stomach:                Appears normal, left                                                                        sided  Cerebellum:            Previously seen  Abdomen:                Appears normal  Posterior Fossa:       Previously seen        Abdominal Wall:         Previously seen  Nuchal Fold:           Previously seen        Cord Vessels:           Appears normal (3                                                                        vessel cord)  Face:                  Orbits and profile     Kidneys:                Appear normal                         previously seen  Lips:                  Previously seen        Bladder:                Appears normal  Thoracic:              Previously seen        Spine:                  Previously seen  Heart:                 Appears normal         Upper Extremities:      Previously seen                         (4CH, axis, and                         situs)  RVOT:                  Previously seen        Lower Extremities:      Previously seen  LVOT:                  Previously seen  Other:  Female gender, heels and 5th digits, nasal bone and open hands           previously visualized. Technically difficult due to fetal position and          oligohydroamnios ---------------------------------------------------------------------- Cervix Uterus Adnexa  Cervix  Length:           3.34  cm.  Normal appearance by transabdominal scan. ---------------------------------------------------------------------- Impression  Normal interval growth.  Premature rupture membranes  AMA ---------------------------------------------------------------------- Recommendations  Continue inpatient management. ----------------------------------------------------------------------               Sander Nephew, MD Electronically Signed Final Report   12/10/2018 12:57 pm ----------------------------------------------------------------------   Medications:  Scheduled . aspirin  81 mg Oral Daily  . calcium carbonate  2 tablet  Oral BID  . ferrous gluconate  324 mg Oral Q breakfast  . prenatal multivitamin  1 tablet Oral Q1200  . rho (d) immune globulin  300 mcg Intravenous Once  . senna-docusate  1 tablet Oral BID  . sodium chloride flush  10 mL Intravenous Q12H   I have reviewed the patient's current medications.  ASSESSMENT: Patient Active Problem List   Diagnosis Date Noted  . Anemia in pregnancy 01/04/2019  . Preterm premature rupture of membranes (PPROM) in second trimester, antepartum 12/09/2018  . Supervision of high-risk pregnancy 09/17/2018  . Rh negative status during pregnancy 09/17/2018  . AMA (advanced maternal age) primigravida 35+ 09/17/2018    PLAN: - s/p Feraheme for anemia last week. Will repeat Feraheme tomorrow - Continue monitoring for si/sx of chorioamnionitis - Maternal- fetal status stable -Continue close monitoring  Verita Schneiders, MD 01/08/2019,7:38 AM

## 2019-01-09 NOTE — Progress Notes (Signed)
Patient ID: Maria Reed, female   DOB: August 10, 1980, 38 y.o.   MRN: [id number] [place]) NOTE  Maria Reed is a 39 y.o. G3P0020 at [redacted]w[redacted]d by who is admitted for rupture of membranes, PROM.   Fetal presentation is cephalic. Length of Stay:  31  Days  Subjective:  Patient reports the fetal movement as active. Patient reports uterine contraction  activity as none. Patient reports  vaginal bleeding as none. Patient describes fluid per vagina as Clear.  Vitals:  Blood pressure 101/65, pulse 91, temperature 98.4 F (36.9 C), temperature source Oral, resp. rate 18, height 5\' 2"  (1.575 m), weight 48.4 kg, last menstrual period 07/01/2018, SpO2 100 %. Physical Examination:  General appearance - alert, well appearing, and in no distress Heart - normal rate and regular rhythm Abdomen - soft, nontender, nondistended Fundal Height:  size equals dates. Extremities: extremities normal, atraumatic, no cyanosis or edema and Homans sign is negative, no sign of DVT  Membranes:ruptured  Fetal Monitoring:   Fetal Heart Rate A  Mode External filed at 01/08/2019 2038  Baseline Rate (A) 140 bpm filed at 01/08/2019 2038  Variability 6-25 BPM filed at 01/08/2019 2038  Accelerations 15 x 15 filed at 01/08/2019 2038  Decelerations Variable filed at 01/08/2019 2038   Tracing reviewed  Labs:  Results for orders placed or performed during the hospital encounter of 12/09/18 (from the past 24 hour(s))  Type and screen [place]   Collection Time: 01/08/19 11:35 AM  Result Value Ref Range   ABO/RH(D) A NEG    Antibody Screen NEG    Sample Expiration      01/11/2019,2359 Performed at [facility], [place] [address] [place], [place] [address]   CBC   Collection Time: 01/08/19 11:40 AM  Result Value Ref Range   WBC 8.0 4.0 - 10.5 K/uL   RBC 2.56 (L) 3.87 - 5.11 MIL/uL   Hemoglobin 8.9 (L) 12.0 - 15.0 g/dL   HCT 26.0 (L) 36.0 - 46.0 %   MCV 101.6  (H) 80.0 - 100.0 fL   MCH 34.8 (H) 26.0 - 34.0 pg   MCHC 34.2 30.0 - 36.0 g/dL   RDW 13.8 11.5 - 15.5 %   Platelets 306 150 - 400 K/uL   nRBC 0.0 0.0 - 0.2 %     Medications:  Scheduled . aspirin  81 mg Oral Daily  . calcium carbonate  2 tablet Oral BID  . ferrous gluconate  324 mg Oral Q breakfast  . prenatal multivitamin  1 tablet Oral Q1200  . rho (d) immune globulin  300 mcg Intravenous Once  . senna-docusate  1 tablet Oral BID  . sodium chloride flush  10 mL Intravenous Q12H   I have reviewed the patient's current medications.  ASSESSMENT: Patient Active Problem List   Diagnosis Date Noted  . Anemia in pregnancy 01/04/2019  . Preterm premature rupture of membranes (PPROM) in second trimester, antepartum 12/09/2018  . Supervision of high-risk pregnancy 09/17/2018  . Rh negative status during pregnancy 09/17/2018  . AMA (advanced maternal age) primigravida 35+ 09/17/2018    PLAN: Ferriheme today IV  Continue to monitor for PTL or s/sx of infection  Maria Reed 01/09/2019,9:23 AM

## 2019-01-09 NOTE — Progress Notes (Signed)
MD notified to review strip. No new orders.

## 2019-01-10 NOTE — Progress Notes (Signed)
Patient ID: Maria Reed, female   DOB: [birth year], 38 y.o.   MRN: [id number] [place]) NOTE  Maria Reed is a 38 y.o. [id number] with Estimated Date of Delivery: 04/07/19   By   [redacted]w[redacted]d  who is admitted for PROM.    Fetal presentation is cephalic. Length of Stay:  32  Days  Date of admission:12/09/2018  Subjective: No complaints Patient reports the fetal movement as active. Patient reports uterine contraction  activity as none. Patient reports  vaginal bleeding as none. Patient describes fluid per vagina as Clear.  Vitals:  Blood pressure (!) 97/58, pulse 94, temperature 98.2 F (36.8 C), temperature source Oral, resp. rate 18, height 5\' 2"  (1.575 m), weight 48.4 kg, last menstrual period 07/01/2018, SpO2 98 %. Vitals:   01/09/19 1631 01/09/19 2000 01/09/19 2350 01/10/19 0614  BP: 102/60 (!) 98/57 97/61 (!) 97/58  Pulse: 98 96 89 94  Resp: 18 18 18 18   Temp: 97.9 F (36.6 C) 98.2 F (36.8 C) 97.8 F (36.6 C) 98.2 F (36.8 C)  TempSrc: Oral Oral Oral Oral  SpO2: 99% 98% 97% 98%  Weight:      Height:       Physical Examination:  General appearance - alert, well appearing, and in no distress Abdomen - soft, nontender, nondistended, no masses or organomegaly Fundal Height:  size equals dates Pelvic Exam:  examination not indicated Cervical Exam: Not evaluated.  Extremities: extremities normal, atraumatic, no cyanosis or edema with DTRs 2+ bilaterally Membranes:ruptured, clear fluid  Fetal Monitoring:  Baseline: 130s bpm, Variability: Good {> 6 bpm), Accelerations: Reactive and Decelerations: Absent   reactive  Labs:  No results found for this or any previous visit (from the past 24 hour(s)).  Imaging Studies:     Currently EPIC will not allow sonographic studies to automatically populate into notes.  In the meantime, copy and paste results into note or free text.  Medications:  Scheduled . aspirin  81 mg Oral Daily  . calcium carbonate  2  tablet Oral BID  . ferrous gluconate  324 mg Oral Q breakfast  . prenatal multivitamin  1 tablet Oral Q1200  . rho (d) immune globulin  300 mcg Intravenous Once  . senna-docusate  1 tablet Oral BID  . sodium chloride flush  10 mL Intravenous Q12H   I have reviewed the patient's current medications.  ASSESSMENT: [id number] [redacted]w[redacted]d Estimated Date of Delivery: 04/07/19  Patient Active Problem List   Diagnosis Date Noted  . Anemia in pregnancy 01/04/2019  . Preterm premature rupture of membranes (PPROM) in second trimester, antepartum 12/09/2018  . Supervision of high-risk pregnancy 09/17/2018  . Rh negative status during pregnancy 09/17/2018  . AMA (advanced maternal age) primigravida 35+ 09/17/2018    PLAN: >Continue current care plan:if undelivered, deliver at 34 weeks, in house observation, allow spontaneous labor and IOL if evidence of intra amniotic infection  >S/P feraheme 01/09/2019  >S/P betamethasone dosing 12/09/18 and 12/10/18 >S/P magnesium 12/09/18 >S/P latent antibiotics   H  01/10/2019,7:11 AM

## 2019-01-11 ENCOUNTER — Other Ambulatory Visit: Payer: Self-pay

## 2019-01-11 ENCOUNTER — Inpatient Hospital Stay (HOSPITAL_COMMUNITY): Payer: Medicaid Other | Admitting: Anesthesiology

## 2019-01-11 ENCOUNTER — Encounter (HOSPITAL_COMMUNITY): Payer: Self-pay | Admitting: *Deleted

## 2019-01-11 DIAGNOSIS — O411290 Chorioamnionitis, unspecified trimester, not applicable or unspecified: Secondary | ICD-10-CM | POA: Diagnosis not present

## 2019-01-11 LAB — CBC
HCT: 25.4 % — ABNORMAL LOW (ref 36.0–46.0)
Hemoglobin: 8.9 g/dL — ABNORMAL LOW (ref 12.0–15.0)
MCH: 34.5 pg — ABNORMAL HIGH (ref 26.0–34.0)
MCHC: 35 g/dL (ref 30.0–36.0)
MCV: 98.4 fL (ref 80.0–100.0)
Platelets: 288 10*3/uL (ref 150–400)
RBC: 2.58 MIL/uL — ABNORMAL LOW (ref 3.87–5.11)
RDW: 13.6 % (ref 11.5–15.5)
WBC: 9.6 10*3/uL (ref 4.0–10.5)
nRBC: 0 % (ref 0.0–0.2)

## 2019-01-11 LAB — TYPE AND SCREEN
ABO/RH(D): A NEG
Antibody Screen: NEGATIVE

## 2019-01-11 MED ORDER — OXYTOCIN 40 UNITS IN NORMAL SALINE INFUSION - SIMPLE MED
2.5000 [IU]/h | INTRAVENOUS | Status: DC
  Filled 2019-01-11: qty 1000

## 2019-01-11 MED ORDER — LIDOCAINE HCL (PF) 1 % IJ SOLN: 30.0000 mL | INTRAMUSCULAR | Status: DC | PRN

## 2019-01-11 MED ORDER — PHENYLEPHRINE 40 MCG/ML (10ML) SYRINGE FOR IV PUSH (FOR BLOOD PRESSURE SUPPORT): 80.0000 ug | PREFILLED_SYRINGE | INTRAVENOUS | Status: DC | PRN

## 2019-01-11 MED ORDER — LACTATED RINGERS IV SOLN: INTRAVENOUS | Status: DC

## 2019-01-11 MED ORDER — LACTATED RINGERS IV BOLUS
500.0000 mL | Freq: Once | INTRAVENOUS | Status: AC
  Administered 2019-01-11: 500 mL via INTRAVENOUS

## 2019-01-11 MED ORDER — GENTAMICIN SULFATE 40 MG/ML IJ SOLN
5.0000 mg/kg | INTRAVENOUS | Status: DC
  Administered 2019-01-11: 240 mg via INTRAVENOUS
  Filled 2019-01-11: qty 6

## 2019-01-11 MED ORDER — LACTATED RINGERS IV SOLN: 500.0000 mL | INTRAVENOUS | Status: DC | PRN

## 2019-01-11 MED ORDER — SODIUM CHLORIDE (PF) 0.9 % IJ SOLN
INTRAMUSCULAR | Status: DC | PRN
  Administered 2019-01-11: 12 mL/h via EPIDURAL

## 2019-01-11 MED ORDER — CYCLOBENZAPRINE HCL 10 MG PO TABS
10.0000 mg | ORAL_TABLET | Freq: Once | ORAL | Status: AC
  Administered 2019-01-11: 10 mg via ORAL
  Filled 2019-01-11: qty 1

## 2019-01-11 MED ORDER — BETAMETHASONE SOD PHOS & ACET 6 (3-3) MG/ML IJ SUSP
12.0000 mg | INTRAMUSCULAR | Status: DC
  Administered 2019-01-11: 18:00:00 12 mg via INTRAMUSCULAR
  Filled 2019-01-11: qty 5

## 2019-01-11 MED ORDER — EPHEDRINE 5 MG/ML INJ: 10.0000 mg | INTRAVENOUS | Status: DC | PRN

## 2019-01-11 MED ORDER — ACETAMINOPHEN 325 MG PO TABS: 650.0000 mg | ORAL_TABLET | ORAL | Status: DC | PRN

## 2019-01-11 MED ORDER — LACTATED RINGERS IV SOLN: 500.0000 mL | Freq: Once | INTRAVENOUS | Status: DC

## 2019-01-11 MED ORDER — ONDANSETRON HCL 4 MG/2ML IJ SOLN: 4.0000 mg | Freq: Four times a day (QID) | INTRAMUSCULAR | Status: DC | PRN

## 2019-01-11 MED ORDER — FENTANYL CITRATE (PF) 100 MCG/2ML IJ SOLN
50.0000 ug | INTRAMUSCULAR | Status: DC | PRN
  Administered 2019-01-11: 50 ug via INTRAVENOUS
  Filled 2019-01-11: qty 2

## 2019-01-11 MED ORDER — SODIUM CHLORIDE 0.9 % IV SOLN
2.0000 g | Freq: Four times a day (QID) | INTRAVENOUS | Status: DC
  Administered 2019-01-11: 21:00:00 2 g via INTRAVENOUS
  Filled 2019-01-11: qty 2000

## 2019-01-11 MED ORDER — PHENYLEPHRINE 40 MCG/ML (10ML) SYRINGE FOR IV PUSH (FOR BLOOD PRESSURE SUPPORT)
80.0000 ug | PREFILLED_SYRINGE | INTRAVENOUS | Status: DC | PRN
  Filled 2019-01-11: qty 10

## 2019-01-11 MED ORDER — SOD CITRATE-CITRIC ACID 500-334 MG/5ML PO SOLN: 30.0000 mL | ORAL | Status: DC | PRN

## 2019-01-11 MED ORDER — DIPHENHYDRAMINE HCL 50 MG/ML IJ SOLN: 12.5000 mg | INTRAMUSCULAR | Status: DC | PRN

## 2019-01-11 MED ORDER — LIDOCAINE HCL (PF) 1 % IJ SOLN
INTRAMUSCULAR | Status: DC | PRN
  Administered 2019-01-11: 10 mL via EPIDURAL

## 2019-01-11 MED ORDER — FENTANYL-BUPIVACAINE-NACL 0.5-0.125-0.9 MG/250ML-% EP SOLN
12.0000 mL/h | EPIDURAL | Status: DC | PRN
  Filled 2019-01-11: qty 250

## 2019-01-11 MED ORDER — OXYTOCIN BOLUS FROM INFUSION: 500.0000 mL | Freq: Once | INTRAVENOUS | Status: DC

## 2019-01-11 NOTE — Progress Notes (Signed)
CSW met with patient at her bedside.  When CSW arrived patient was being cared for by her bedside nurse.  Patient's facial expression was indication that patient was scared as she was tearful while CSW was present. Patient stated, "Glennon Mac is not doing right and is being too active."  Bedside nurse confirmed concerns regarding patient contracting at 27 weeks. CSW processed interventions to assist patient with remaining calm and CSW agreed to come back after lunch to play cards with patient.  Patient expressed gratitude and was happy about the suggestion.   Laurey Arrow, MSW, LCSW Clinical Social Work [phone]

## 2019-01-11 NOTE — Progress Notes (Signed)
Antepartum LOS: 7   Maria Reed, 38 y.o., with pPROM on Dec 09, 2018 at 23 weeks.  She has remained stable s/p latency abx, IVF, BMZ dosing, and bedrest.  OB History    Gravida  3   Para      Term      Preterm      AB  2   Living        SAB  2   TAB      Ectopic      Multiple      Live Births             Subjective -Nurse call reporting patient with increased contractions.  Provider to bedside to assess.  Patient tearful and reports intermittent lower uterine cramping.  She rates them a 8/10 and reports some vaginal spotting this morning, that is abnormal.   Objective  Vitals:   01/10/19 1915 01/10/19 2306 01/11/19 0450 01/11/19 0853  BP: 107/63 (!) 102/57 (!) 95/59 101/67  Pulse: 96 91 89 97  Resp: 18 18 18 18   Temp: 98.2 F (36.8 C) 98.1 F (36.7 C) 97.9 F (36.6 C) 97.6 F (36.4 C)  TempSrc: Oral Oral Oral Oral  SpO2: 98% 98% 98% 100%  Weight:      Height:        No results found for this or any previous visit (from the past 24 hour(s)).  Meds: Scheduled Meds: . aspirin  81 mg Oral Daily  . calcium carbonate  2 tablet Oral BID  . cyclobenzaprine  10 mg Oral Once  . ferrous gluconate  324 mg Oral Q breakfast  . prenatal multivitamin  1 tablet Oral Q1200  . rho (d) immune globulin  300 mcg Intravenous Once  . senna-docusate  1 tablet Oral BID  . sodium chloride flush  10 mL Intravenous Q12H   Continuous Infusions: . lactated ringers     PRN Meds:.acetaminophen, ondansetron, zolpidem   Physical Exam Exam conducted with a chaperone present.  Constitutional:      Appearance: Normal appearance.  HENT:     Head: Normocephalic and atraumatic.  Eyes:     Conjunctiva/sclera: Conjunctivae normal.  Neck:     Musculoskeletal: Normal range of motion.  Pulmonary:     Effort: Pulmonary effort is normal.  Abdominal:     General: Abdomen is flat.     Palpations: Abdomen is soft.     Tenderness: There is abdominal tenderness. Guarding:  Suprapubic Area.  Genitourinary:    Vagina: Vaginal discharge present.     Cervix: Dilated. Discharge present.     Comments: Moderate amt pinkish white vaginal discharge.  Cervix open, appears ~1-2 cm visually.  Skin:    General: Skin is cool and dry.  Neurological:     Mental Status: She is alert and oriented to person, place, and time.     Fetal Assessment FHR: 140 bpm, Mod Var, +Occ. Variable Decels, +Accels UC: Irregular Q 6-9 minutes, palpates mild  Assessment IUP at [redacted]w[redacted]d pPROM Reassuring NST for GA Contractions  Plan -S/P 531mL Bolus, will give additional bolus -Discussed exam findings with patient. -Will give Flexeril for pain and discomfort. -Explained to patient that muscle relaxant can sometimes calm uterus too as it is a muscle.  -Continue current plan of care. -Nurse instructed to call provider if other issues arise.  -Dr. Eddie North to follow as appropriate   Maria Conners, MSN, CNM Advanced Practice Provider, [facility] 01/11/2019, 10:20 AM

## 2019-01-11 NOTE — Anesthesia Procedure Notes (Signed)
Epidural Patient location during procedure: OB Start time: 01/11/2019 7:51 PM End time: 01/11/2019 8:04 PM  Staffing Anesthesiologist: Lidia Collum, MD Performed: anesthesiologist   Preanesthetic Checklist Completed: patient identified, pre-op evaluation, timeout performed, IV checked, risks and benefits discussed and monitors and equipment checked  Epidural Patient position: sitting Prep: DuraPrep Patient monitoring: heart rate, continuous pulse ox and blood pressure Approach: midline Location: L3-L4 Injection technique: LOR air  Needle:  Needle type: Tuohy  Needle gauge: 17 G Needle length: 9 cm Needle insertion depth: 5 cm Catheter type: closed end flexible Catheter size: 19 Gauge Catheter at skin depth: 10 cm Test dose: negative  Assessment Events: blood not aspirated, injection not painful, no injection resistance, negative IV test and no paresthesia  Additional Notes Reason for block:procedure for pain

## 2019-01-11 NOTE — Anesthesia Preprocedure Evaluation (Signed)
Anesthesia Evaluation  Patient identified by MRN, date of birth, ID band Patient awake    Reviewed: Allergy & Precautions, H&P , NPO status , Patient's Chart, lab work & pertinent test results  History of Anesthesia Complications Negative for: history of anesthetic complications  Airway Mallampati: II  TM Distance: >3 FB Neck ROM: full    Dental no notable dental hx.    Pulmonary neg pulmonary ROS,    Pulmonary exam normal        Cardiovascular negative cardio ROS Normal cardiovascular exam Rhythm:regular Rate:Normal     Neuro/Psych negative neurological ROS  negative psych ROS   GI/Hepatic negative GI ROS, Neg liver ROS,   Endo/Other  negative endocrine ROS  Renal/GU negative Renal ROS  negative genitourinary   Musculoskeletal   Abdominal   Peds  Hematology  (+) Blood dyscrasia, anemia ,   Anesthesia Other Findings PPROM @ 23 weeks  Reproductive/Obstetrics (+) Pregnancy                             Anesthesia Physical Anesthesia Plan  ASA: II  Anesthesia Plan: Epidural   Post-op Pain Management:    Induction:   PONV Risk Score and Plan:   Airway Management Planned:   Additional Equipment:   Intra-op Plan:   Post-operative Plan:   Informed Consent: I have reviewed the patients History and Physical, chart, labs and discussed the procedure including the risks, benefits and alternatives for the proposed anesthesia with the patient or authorized representative who has indicated his/her understanding and acceptance.       Plan Discussed with:   Anesthesia Plan Comments:         Anesthesia Quick Evaluation

## 2019-01-11 NOTE — Progress Notes (Signed)
LABOR PROGRESS NOTE  Maria Reed is a 38 y.o. female admitted on 12/09/2018 with PPROM at 23 weeks. Pt is now [redacted]w[redacted]d transferred to L&D for SOL and Triple I .  Subjective: Patient feeling increased pressure.   Objective: BP (!) 100/56   Pulse 95   Temp 99 F (37.2 C) (Oral)   Resp 17   Ht 5\' 2"  (1.575 m)   Wt 48.1 kg   LMP 07/01/2018   SpO2 100%   BMI 19.39 kg/m  or  Vitals:   01/11/19 2030 01/11/19 2035 01/11/19 2040 01/11/19 2045  BP: (!) 98/52 (!) 96/51 96/64 (!) 100/56  Pulse: 91 94 (!) 106 95  Resp: 18 17 17 17   Temp:    99 F (37.2 C)  TempSrc:    Oral  SpO2: 100% 100% 100%   Weight:      Height:       Dilation: 5 Effacement (%): 80 Cervical Position: Posterior Station: -2 Presentation: Vertex Exam by:: Louanne Skye RN FHT: baseline rate 150, moderate varibility, +acel, +decel (variable)  Toco: q 2-3 min   Labs: Lab Results  Component Value Date   WBC 9.6 01/11/2019   HGB 8.9 (L) 01/11/2019   HCT 25.4 (L) 01/11/2019   MCV 98.4 01/11/2019   PLT 288 01/11/2019    Patient Active Problem List   Diagnosis Date Noted  . Chorioamnionitis 01/11/2019  . Anemia in pregnancy 01/04/2019  . Preterm premature rupture of membranes (PPROM) in second trimester, antepartum 12/09/2018  . Supervision of high-risk pregnancy 09/17/2018  . Rh negative status during pregnancy 09/17/2018  . AMA (advanced maternal age) primigravida 35+ 09/17/2018    Assessment / Plan:  Maria Reed is a 38 y.o. female admitted on 12/09/2018 with PPROM at 23 weeks. Pt is now [redacted]w[redacted]d transferred to L&D for SOL and Triple I .  #Labor: Progressing in latent labor now 5 cm on last cervical check. Continue expectant management.  #Pain: IV pain meds; epidural if desires; #FWB: Cat II # ID: GBS unknown. Triple I due to uterine tenderness, increase in FHR baseline. .Continue Amp and Gent 24 hours after delivery.  #PPROM: S/p Mg and BMZ (10/5 & 10/6). Rescue BMZ given 11/6 with second in 24  hours if still pregnant. #Rh neg: Eval post-partum #Anemia: Hgb was 8.9.   Maxie Better, PGY-1, MD OBGYN Faculty Teaching Service  01/11/2019, 9:13 PM

## 2019-01-11 NOTE — Progress Notes (Signed)
Pharmacy Antibiotic Note  [name] is a 38 y.o. female admitted on 12/09/2018 with PPROM at 23 weeks. Pt is now [redacted]w[redacted]d transferred to L&D for SOL and Triple I Pharmacy has been consulted for Gentamicin dosing.  Plan: Gentamicin 240mg  IV q24h (5mg /kg). Will continue to follow  Height: 5\' 2"  (157.5 cm) Weight: 106 lb (48.1 kg) IBW/kg (Calculated) : 50.1  Temp (24hrs), Avg:98.1 F (36.7 C), Min:97.6 F (36.4 C), Max:98.5 F (36.9 C)  Recent Labs  Lab 01/05/19 1055 01/08/19 1140 01/11/19 1133  WBC 6.2 8.0 9.6    CrCl cannot be calculated (Patient's most recent lab result is older than the maximum 21 days allowed.).    No Known Allergies  Antimicrobials this admission: Ampicillin 2 gram IV q6h 11/6 >> Azithromycin 500mg  x 1  10/4 Ampicillin to Amoxcillin for PPROM regimen 10/4- 10/11   Thank you for allowing pharmacy to be a part of this patient's care.  Maria Reed 01/11/2019 7:12 PM

## 2019-01-11 NOTE — Progress Notes (Signed)
Patient ID: Maria Reed, female   DOB: 15-May-1980, 38 y.o.   MRN: [id number] [place]) NOTE  Maria Reed is a 38 y.o. G3P0020 at [redacted]w[redacted]d who is admitted for rupture of membranes.   Fetal presentation is cephalic. Length of Stay:  33  Days  Subjective: Patient had blood stained fluid and contractions earlier, see progress note Patient reports the fetal movement as active. Patient reports uterine contraction  activity as rare. Patient reports  vaginal bleeding as scant staining. Patient describes fluid per vagina as Other staining.  Vitals:  Blood pressure (!) 99/53, pulse 100, temperature 98.5 F (36.9 C), temperature source Oral, resp. rate 18, height 5\' 2"  (1.575 m), weight 48.4 kg, last menstrual period 07/01/2018, SpO2 100 %. Physical Examination:  General appearance - alert, well appearing, and in no distress Heart - normal rate and regular rhythm Abdomen - soft, nontender, nondistended Fundal Height:  size equals dates Cervical Exam: Not evaluated. Extremities: extremities normal, atraumatic, no cyanosis or edema   Membranes:ruptured  Fetal Monitoring:   Fetal Heart Rate A  Mode External  [removed'] filed at 01/11/2019 1159  Baseline Rate (A) 135 bpm filed at 01/11/2019 1159  Variability 6-25 BPM filed at 01/11/2019 1159  Accelerations 15 x 15 filed at 01/11/2019 1159  Decelerations Variable filed at 01/11/2019 1159     Labs:  Results for orders placed or performed during the hospital encounter of 12/09/18 (from the past 24 hour(s))  CBC   Collection Time: 01/11/19 11:33 AM  Result Value Ref Range   WBC 9.6 4.0 - 10.5 K/uL   RBC 2.58 (L) 3.87 - 5.11 MIL/uL   Hemoglobin 8.9 (L) 12.0 - 15.0 g/dL   HCT 25.4 (L) 36.0 - 46.0 %   MCV 98.4 80.0 - 100.0 fL   MCH 34.5 (H) 26.0 - 34.0 pg   MCHC 35.0 30.0 - 36.0 g/dL   RDW 13.6 11.5 - 15.5 %   Platelets 288 150 - 400 K/uL   nRBC 0.0 0.0 - 0.2 %  Type and screen [place]    Collection Time: 01/11/19 11:33 AM  Result Value Ref Range   ABO/RH(D) A NEG    Antibody Screen NEG    Sample Expiration      01/14/2019,2359 Performed at [facility], [address] [place], [place] [address]      Medications:  Scheduled . aspirin  81 mg Oral Daily  . calcium carbonate  2 tablet Oral BID  . ferrous gluconate  324 mg Oral Q breakfast  . prenatal multivitamin  1 tablet Oral Q1200  . rho (d) immune globulin  300 mcg Intravenous Once  . senna-docusate  1 tablet Oral BID  . sodium chloride flush  10 mL Intravenous Q12H   I have reviewed the patient's current medications.  ASSESSMENT: Patient Active Problem List   Diagnosis Date Noted  . Anemia in pregnancy 01/04/2019  . Preterm premature rupture of membranes (PPROM) in second trimester, antepartum 12/09/2018  . Supervision of high-risk pregnancy 09/17/2018  . Rh negative status during pregnancy 09/17/2018  . AMA (advanced maternal age) primigravida 35+ 09/17/2018    PLAN: [redacted]w[redacted]d PPSROM prolonged, assess for recurrent sx of bleeding, infection, preterm labor  Maria Reed 01/11/2019,12:42 PM

## 2019-01-11 NOTE — Progress Notes (Signed)
At 0150 patient c/o lower back pain rating 5 out of 10.  Toco applied to monitor for contractions and Tylenol 650 mg given po.  After 30 minutes of monitoring for contractions, only 2 UCs noted with some uterine irritability noted.  500cc fluid bolus of LR given.  At 0313 patient sleeping.  Denies any pain at the time of completion of bolus.

## 2019-01-11 NOTE — Progress Notes (Signed)
Labor Progress Note Maria Reed is a 38 y.o. G3P0020 at [redacted]w[redacted]d presented for PPROM at 23w now with SOL and Triple I. S: Patient crying and feeling lots of pain and pressure.  O:  BP (!) 89/52 (BP Location: Right Arm)   Pulse 86   Temp 98.1 F (36.7 C) (Oral)   Resp 18   Ht 5\' 2"  (1.575 m)   Wt 48.4 kg   LMP 07/01/2018   SpO2 100%   BMI 19.52 kg/m  EFM: 150, reactive, moderate variability, pos accels, intermittent decels Ctx: infrequent  CVE: Dilation: 2.5 Effacement (%): 70 Cervical Position: Posterior Station: -3 Presentation: Vertex Exam by:: Wilhelmenia Addis   A&P: 38 y.o. [id number] [redacted]w[redacted]d here for PPROM and now SOL and Triple I.  #Labor: Cervical change from 1 to 2 cm since this morning. Transferred to L&D. Expectant management. Vertex by BSUS as well. Anticipate SVD. NICU aware. S/p Mg and BMZ. Rescue BMZ given 11/6 with second in 24 hours if still pregnant. #Pain: IV pain meds; epidural if desires; EFW 1000g #FWB: Cat II #GBS unknown. Triple I due to uterine tenderness, increase in FHR baseline. Patient feels warm subjectively. Amp and Gent ordered. #Rh neg: Eval post-partum  Chauncey Mann, MD 6:46 PM

## 2019-01-11 NOTE — Progress Notes (Signed)
LABOR PROGRESS NOTE  Maria Reed is a 38 y.o. female admitted on 12/09/2018 with PPROM at 23 weeks. Pt is now [redacted]w[redacted]d transferred to L&D for SOL and Triple I .  Subjective: Patient feeling increased pressure.   Objective: BP (!) 95/55   Pulse (!) 101   Temp 98.2 F (36.8 C) (Oral)   Resp 20   Ht 5\' 2"  (1.575 m)   Wt 48.1 kg   LMP 07/01/2018   SpO2 100%   BMI 19.39 kg/m  or  Vitals:   01/11/19 2130 01/11/19 2147 01/11/19 2200 01/11/19 2230  BP: (!) 96/51  (!) 98/56 (!) 95/55  Pulse: (!) 101  (!) 102 (!) 101  Resp: 17  18 20   Temp:  98.2 F (36.8 C)    TempSrc:  Oral    SpO2:      Weight:      Height:       Dilation: 8 Effacement (%): 80 Cervical Position: Posterior Station: 0 Presentation: Vertex Exam by:: DR. Dione Plover FHT: baseline rate 150, moderate varibility, +acel, +decel (variables)  Toco: q3 min   Labs: Lab Results  Component Value Date   WBC 9.6 01/11/2019   HGB 8.9 (L) 01/11/2019   HCT 25.4 (L) 01/11/2019   MCV 98.4 01/11/2019   PLT 288 01/11/2019    Patient Active Problem List   Diagnosis Date Noted  . Chorioamnionitis 01/11/2019  . Anemia in pregnancy 01/04/2019  . Preterm premature rupture of membranes (PPROM) in second trimester, antepartum 12/09/2018  . Supervision of high-risk pregnancy 09/17/2018  . Rh negative status during pregnancy 09/17/2018  . AMA (advanced maternal age) primigravida 35+ 09/17/2018    Assessment / Plan:  Maria Reed is a 38 y.o. female admitted on 12/09/2018 with PPROM at 23 weeks. Pt is now [redacted]w[redacted]d transferred to L&D for SOL and Triple I .  #Labor: Progressing in latent labor  From 5  To 8 cm on last cervical check. Continue expectant management.  #Pain: IV pain meds; epidural if desires #FWB: Cat II # ID: GBS unknown. Triple I due to uterine tenderness, increase in FHR baseline. Continue Amp and Gent 24 hours after delivery.  #PPROM: S/p Mg and BMZ (10/5 & 10/6). Rescue BMZ given 11/6 with second in 24 hours if  still pregnant. #Rh neg: Eval post-partum #Anemia: Hgb was 8.9.   Maxie Better, PGY-1, MD OBGYN Faculty Teaching Service  01/11/2019, 11:00 PM

## 2019-01-12 ENCOUNTER — Encounter (HOSPITAL_COMMUNITY): Payer: Self-pay

## 2019-01-12 DIAGNOSIS — O42112 Preterm premature rupture of membranes, onset of labor more than 24 hours following rupture, second trimester: Secondary | ICD-10-CM

## 2019-01-12 DIAGNOSIS — O411220 Chorioamnionitis, second trimester, not applicable or unspecified: Secondary | ICD-10-CM

## 2019-01-12 DIAGNOSIS — Z3A27 27 weeks gestation of pregnancy: Secondary | ICD-10-CM

## 2019-01-12 MED ORDER — OXYCODONE HCL 5 MG PO TABS: 10.0000 mg | ORAL_TABLET | ORAL | Status: DC | PRN

## 2019-01-12 MED ORDER — SIMETHICONE 80 MG PO CHEW: 80.0000 mg | CHEWABLE_TABLET | ORAL | Status: DC | PRN

## 2019-01-12 MED ORDER — DIBUCAINE (PERIANAL) 1 % EX OINT: 1.0000 "application " | TOPICAL_OINTMENT | CUTANEOUS | Status: DC | PRN

## 2019-01-12 MED ORDER — PRENATAL MULTIVITAMIN CH
1.0000 | ORAL_TABLET | Freq: Every day | ORAL | Status: DC
  Administered 2019-01-12 – 2019-01-13 (×2): 1 via ORAL
  Filled 2019-01-12 (×2): qty 1

## 2019-01-12 MED ORDER — COCONUT OIL OIL
1.0000 "application " | TOPICAL_OIL | Status: DC | PRN
  Administered 2019-01-13: 1 via TOPICAL

## 2019-01-12 MED ORDER — GENTAMICIN SULFATE 40 MG/ML IJ SOLN
5.0000 mg/kg | INTRAVENOUS | Status: AC
  Administered 2019-01-12: 240 mg via INTRAVENOUS
  Filled 2019-01-12: qty 6

## 2019-01-12 MED ORDER — RHO D IMMUNE GLOBULIN 1500 UNIT/2ML IJ SOSY
300.0000 ug | PREFILLED_SYRINGE | Freq: Once | INTRAMUSCULAR | Status: AC
  Administered 2019-01-12: 300 ug via INTRAMUSCULAR
  Filled 2019-01-12: qty 2

## 2019-01-12 MED ORDER — MAGNESIUM HYDROXIDE 400 MG/5ML PO SUSP: 30.0000 mL | ORAL | Status: DC | PRN

## 2019-01-12 MED ORDER — IBUPROFEN 600 MG PO TABS
600.0000 mg | ORAL_TABLET | Freq: Four times a day (QID) | ORAL | Status: DC
  Administered 2019-01-12 – 2019-01-13 (×6): 600 mg via ORAL
  Filled 2019-01-12 (×7): qty 1

## 2019-01-12 MED ORDER — BENZOCAINE-MENTHOL 20-0.5 % EX AERO: 1.0000 "application " | INHALATION_SPRAY | CUTANEOUS | Status: DC | PRN

## 2019-01-12 MED ORDER — ACETAMINOPHEN 325 MG PO TABS: 650.0000 mg | ORAL_TABLET | ORAL | Status: DC | PRN

## 2019-01-12 MED ORDER — FERROUS SULFATE 325 (65 FE) MG PO TABS
325.0000 mg | ORAL_TABLET | Freq: Two times a day (BID) | ORAL | Status: DC
  Administered 2019-01-12: 17:00:00 325 mg via ORAL
  Filled 2019-01-12: qty 1

## 2019-01-12 MED ORDER — ONDANSETRON HCL 4 MG/2ML IJ SOLN: 4.0000 mg | INTRAMUSCULAR | Status: DC | PRN

## 2019-01-12 MED ORDER — WITCH HAZEL-GLYCERIN EX PADS: 1.0000 "application " | MEDICATED_PAD | CUTANEOUS | Status: DC | PRN

## 2019-01-12 MED ORDER — OXYCODONE HCL 5 MG PO TABS
5.0000 mg | ORAL_TABLET | ORAL | Status: DC | PRN
  Administered 2019-01-12 (×2): 5 mg via ORAL
  Filled 2019-01-12 (×2): qty 1

## 2019-01-12 MED ORDER — ONDANSETRON HCL 4 MG PO TABS: 4.0000 mg | ORAL_TABLET | ORAL | Status: DC | PRN

## 2019-01-12 MED ORDER — SODIUM CHLORIDE 0.9 % IV SOLN
2.0000 g | Freq: Four times a day (QID) | INTRAVENOUS | Status: AC
  Administered 2019-01-12 (×4): 2 g via INTRAVENOUS
  Filled 2019-01-12 (×4): qty 2000

## 2019-01-12 MED ORDER — SODIUM CHLORIDE 0.9% FLUSH
3.0000 mL | Freq: Two times a day (BID) | INTRAVENOUS | Status: DC
  Administered 2019-01-12 – 2019-01-13 (×4): 3 mL via INTRAVENOUS

## 2019-01-12 MED ORDER — DIPHENHYDRAMINE HCL 25 MG PO CAPS: 25.0000 mg | ORAL_CAPSULE | Freq: Four times a day (QID) | ORAL | Status: DC | PRN

## 2019-01-12 MED ORDER — SENNOSIDES-DOCUSATE SODIUM 8.6-50 MG PO TABS
2.0000 | ORAL_TABLET | ORAL | Status: DC
  Administered 2019-01-12 – 2019-01-13 (×2): 2 via ORAL
  Filled 2019-01-12 (×2): qty 2

## 2019-01-12 NOTE — Lactation Note (Signed)
This note was copied from a baby's chart. Lactation Consultation Note Baby 4 hrs old at 63 6/7 wks. Mom has been on bed rest in hospital for 1 month. Discussed pumping w/mom. Mom implied she didn't want to pump right now, she watched LC set up DEBP and explained how to use and clean pump. Mom knows to pump q3h for 15-20 min. Hand expression taught. Mom has small breast w/small areolas and small semi flat nipples. Mom unable to handle hand expression d/t extreme tenderness.  Placed mom's hands on breast trying to teach her hand expression. Mom will need assisted again in hand expressing. Encouraged breast massage. Explained how to make hands free bra. No colostrum noted d/t mom was actively expressing. NICU and Lactation brochure given. Milk storage reviewed. Bottles and colostrum containers given in a bag. Mom has [place] and needs DEBP. [place] filled and faxed information to [facility].  Patient Name: Maria Reed [id number] Date: 01/12/2019 Reason for consult: Initial assessment;Preterm <34wks;Infant < 6lbs;Primapara;NICU baby   Maternal Data    Feeding    LATCH Score       Type of Nipple: Flat(semi flat)  Comfort (Breast/Nipple): Filling, red/small blisters or bruises, mild/mod discomfort(breast tender)        Interventions Interventions: Breast massage;Hand express;Breast compression;DEBP  Lactation Tools Discussed/Used Tools: Pump Breast pump type: Double-Electric Breast Pump WIC Program: Yes Pump Review: Setup, frequency, and cleaning;Milk Storage Initiated by:: Allayne Stack RN IBCLC Date initiated:: 01/12/19   Consult Status Consult Status: Follow-up Date: 01/13/19 Follow-up type: In-patient    Theodoro Kalata 01/12/2019, 4:45 AM

## 2019-01-12 NOTE — Discharge Summary (Signed)
Postpartum Discharge Summary     Patient Name: Maria Reed DOB: 09/04/1980 MRN: [id number]  Date of admission: 12/09/2018 Delivering Provider: ECKSTAT, MATTHEW M   Date of discharge: 01/14/2019  Admitting diagnosis: Pregnancy at 23/0. SROM at 23/0. Rh negative. H/o LEEP     Discharge diagnosis: Preterm Pregnancy Delivered                                                                                                Post partum procedures:rhogam  Augmentation: None  Complications: Intrauterine Inflammation or infection (Chorioamniotis) and ROM>24 hours  Hospital course:  Onset of Labor With Vaginal Delivery     38 y.o. yo G3P0121 at [redacted]w[redacted]d was admitted with PPROM at 23 weeks on 12/09/2018. Patient had complex course as follows: She was managed on antepartum until 01/11/2019 at which point she started to have painful contractions and to make cervical change. At that time she also developed uterine tenderness and was diagnosed with Triple I and transferred to L&D. Initial SVE on arrival was 2.5 cm. She progressed spontaneously to fully dilated.  Membrane Rupture Time/Date: 10:14 AM ,12/09/2018   Intrapartum Procedures: Episiotomy: None [1]                                         Lacerations:  None [1]  Patient had a delivery of a Viable infant. 01/12/2019  Information for the patient's newborn:  [place], Boy Copeland [[id number]]  Delivery Method: Vaginal, Spontaneous(Filed from Delivery Summary)     Pateint had an uncomplicated postpartum course.  She is ambulating, tolerating a regular diet, passing flatus, and urinating well. Patient is discharged home in stable condition on 01/14/19.  Delivery time: 12:31 AM    Magnesium Sulfate received: Yes BMZ received: Yes Rhophylac:Yes MMR:N/A Transfusion:No  Physical exam  Vitals:   01/13/19 1535 01/13/19 1939 01/14/19 0611 01/14/19 0750  BP: (!) 85/54 99/67 94/62 101/61  Pulse: 83 76 76 77  Resp: 18 16 16 18  Temp: 98.3 F (36.8 C)  98.3 F (36.8 C) 97.9 F (36.6 C) 98.2 F (36.8 C)  TempSrc: Oral Oral Oral Oral  SpO2: 99% 100% 100% 100%  Weight:      Height:       General: alert Lochia: appropriate Uterine Fundus: nttp, firm Incision: N/A DVT Evaluation: No evidence of DVT seen on physical exam. Labs: Lab Results  Component Value Date   WBC 9.6 01/11/2019   HGB 8.9 (L) 01/11/2019   HCT 25.4 (L) 01/11/2019   MCV 98.4 01/11/2019   PLT 288 01/11/2019   CMP Latest Ref Rng & Units 12/12/2018  Glucose 70 - 99 mg/dL 125(H)  BUN 6 - 20 mg/dL 6  Creatinine 0.44 - 1.00 mg/dL 0.48  Sodium 135 - 145 mmol/L 135  Potassium 3.5 - 5.1 mmol/L 3.5  Chloride 98 - 111 mmol/L 105  CO2 22 - 32 mmol/L 21(L)  Calcium 8.9 - 10.3 mg/dL 6.9(L)  Total Protein 6.5 - 8.1 g/dL 5.9(L)  Total Bilirubin 0.3 - 1.2   mg/dL 0.5  Alkaline Phos 38 - 126 U/L 37(L)  AST 15 - 41 U/L 57(H)  ALT 0 - 44 U/L 50(H)    Discharge instruction: per After Visit Summary and "Baby and Me Booklet".  After visit meds:  Allergies as of 01/14/2019   No Known Allergies     Medication List    STOP taking these medications   aspirin EC 81 MG tablet   multivitamin-prenatal 27-0.8 MG Tabs tablet     TAKE these medications   acetaminophen 325 MG tablet Commonly known as: Tylenol Take 2 tablets (650 mg total) by mouth every 4 (four) hours as needed (for pain scale < 4).   benzocaine-Menthol 20-0.5 % Aero Commonly known as: DERMOPLAST Apply 1 application topically as needed for irritation (perineal discomfort).   Blood Pressure Monitor Kit 1 Device by Does not apply route once a week. To be monitored Regularly at home. What changed: Another medication with the same name was removed. Continue taking this medication, and follow the directions you see here.   ferrous sulfate 300 (60 Fe) MG/5ML syrup Take 5 mLs (300 mg total) by mouth 2 (two) times daily with a meal.   GERITOL COMPLETE PO Take 15 mLs by mouth every Monday, Wednesday, and  Friday.   ibuprofen 600 MG tablet Commonly known as: ADVIL Take 1 tablet (600 mg total) by mouth every 6 (six) hours as needed for mild pain or cramping.   polyethylene glycol 17 g packet Commonly known as: MIRALAX / GLYCOLAX Take 17 g by mouth daily. Start taking on: January 15, 2019       Diet: routine diet  Activity: Advance as tolerated. Pelvic rest for 6 weeks.   Outpatient follow up:6 weeks Follow up Appt: Future Appointments  Date Time Provider [place]  02/18/2019 10:00 AM Constant, Vickii Chafe, MD [place] None   Follow up Visit: [place]. Go in 35 day(s).   Specialty: Obstetrics and Gynecology Why: after delivery visit Contact information: [address] [place] [phone]           Please schedule this patient for Postpartum visit in: 6 weeks with the following provider: MD  For C/S patients schedule nurse incision check in weeks 2 weeks: no  High risk pregnancy complicated by: PPROM, Triple I, Rh negative  Delivery mode: SVD  Anticipated Birth Control: other/unsure  PP Procedures needed: none  Schedule Integrated BH visit: yes   Newborn Data: Live born female  Birth Weight: 1 lb 15 oz (880 g) APGAR: 5, 7  Newborn Delivery   Birth date/time: 01/12/2019 00:31:00 Delivery type: Vaginal, Spontaneous      Baby Feeding: Breast Disposition:NICU   01/14/2019 Aletha Halim, MD

## 2019-01-12 NOTE — Progress Notes (Addendum)
CSW met with MOB at bedside to check in following delivery. MOB appeared to be in good-spirits and stated she was doing good. Per MOB, she was able to see infant earlier this morning and he was doing fine. At this time, nursing staff entered the room and CSW agreed to return later to check in.  Update: CSW received phone call from RN regarding MOB's visit to the NICU and RNs noted concerns. CSW met with MOB at bedside to check in from earlier visit. CSW allowed MOB to speak about her labor and delivery process and about her experiences thus far. MOB still appeared to be in good-spirits and was pleasant and appropriate with CSW. MOB has been closely followed by CSW A. Boyd-Gilyard and CSW K. Long since MOB has been admitted. CSW A. Boyd-Gilyard to follow up with MOB on Monday to complete full assessment.   Maria Reed, [place]  [facility] and [employer] [phone]

## 2019-01-12 NOTE — Anesthesia Postprocedure Evaluation (Signed)
Anesthesia Post Note  Patient: Scientist, product/process development  Procedure(s) Performed: AN AD HOC LABOR EPIDURAL     Patient location during evaluation: OB High Risk Anesthesia Type: Epidural Level of consciousness: awake and alert Pain management: pain level controlled Vital Signs Assessment: post-procedure vital signs reviewed and stable Respiratory status: spontaneous breathing Cardiovascular status: stable Postop Assessment: no headache, adequate PO intake, no backache, able to ambulate, patient able to bend at knees and epidural receding Anesthetic complications: no    Last Vitals:  Vitals:   01/12/19 0410 01/12/19 0740  BP: (!) 88/54 92/68  Pulse: 82 78  Resp: 16 16  Temp: 36.8 C 36.8 C  SpO2: 100% 100%    Last Pain:  Vitals:   01/12/19 0835  TempSrc:   PainSc: 0-No pain   Pain Goal: Patients Stated Pain Goal: 7 (01/11/19 1906)                 Ailene Ards

## 2019-01-13 LAB — RH IG WORKUP (INCLUDES ABO/RH)
ABO/RH(D): A NEG
Fetal Screen: NEGATIVE
Gestational Age(Wks): 27
Unit division: 0

## 2019-01-13 MED ORDER — FERROUS SULFATE 300 (60 FE) MG/5ML PO SYRP
300.0000 mg | ORAL_SOLUTION | Freq: Two times a day (BID) | ORAL | Status: DC
  Administered 2019-01-13 – 2019-01-14 (×3): 300 mg via ORAL
  Filled 2019-01-13 (×4): qty 5

## 2019-01-13 NOTE — Progress Notes (Signed)
Faculty Attending Note  Post Partum Day 1  Subjective: Patient is feeling well. She reports well controlled pain on PO pain meds. She is ambulating and denies light-headedness or dizziness. She is passing flatus. She is tolerating a regular diet without nausea/vomiting. Bleeding is moderate, occasionally heavier than others. She is breast pumping and wondering how that will be fed to baby. Baby is in NICU and doing well.  Objective: Blood pressure (!) 86/55, pulse 83, temperature 97.9 F (36.6 C), resp. rate 16, height 5\' 2"  (1.575 m), weight 48.1 kg, last menstrual period 07/01/2018, SpO2 100 %, unknown if currently breastfeeding. Temp:  [97.8 F (36.6 C)-98.4 F (36.9 C)] 97.9 F (36.6 C) (11/08 0552) Pulse Rate:  [69-96] 83 (11/07 2302) Resp:  [16-17] 16 (11/08 0552) BP: (76-100)/(44-64) 86/55 (11/07 2302) SpO2:  [99 %-100 %] 100 % (11/08 0552)  Physical Exam:  General: alert, oriented, cooperative Chest: normal respiratory effort Heart: RRR  Abdomen: soft, appropriately tender to palpation  Uterine Fundus: firm, 2 fingers below the umbilicus Lochia: moderate, rubra DVT Evaluation: no evidence of DVT Extremities: no edema, no calf tenderness  UOP: voiding spontaneously  Recent Labs    01/11/19 1133  HGB 8.9*  HCT 25.4*    Assessment/Plan: Patient Active Problem List   Diagnosis Date Noted  . Chorioamnionitis 01/11/2019  . Anemia in pregnancy 01/04/2019  . Preterm premature rupture of membranes (PPROM) in second trimester, antepartum 12/09/2018  . Supervision of high-risk pregnancy 09/17/2018  . Rh negative status during pregnancy 09/17/2018  . AMA (advanced maternal age) primigravida 35+ 09/17/2018    Patient is 38 y.o. [id number] PPD#1 s/p SVD at [redacted]w[redacted]d s/p PPROM at 92 weeks. She is doing very well, recovering appropriately and complains only of some bleeding, pain is minimal. Mostly concerned about infant.   Continue routine post partum care Pain meds prn Regular  diet [place] PO switched to syrup at patient request undecided for birth control Plan for discharge tomorrow   K. Arvilla Meres, M.D. Attending Center for [employer] (Faculty Practice)  01/13/2019, 7:46 AM

## 2019-01-13 NOTE — Lactation Note (Signed)
This note was copied from a baby's chart. Lactation Consultation Note  Patient Name: Maria Reed [id number] Date: 01/13/2019 Reason for consult: Follow-up assessment;NICU baby;1st time breastfeeding;Primapara;Infant < 6lbs;Preterm <34wks  Visited with mom of a 75 hours old pre-term < 2 lbs NICU female. She's a P1 and reported moderate breast changes during the pregnancy. Mom told LC she tried pumping once yesterday but "nothing came ou". Explained to mom that the purpose of pumping early is mainly for breast stimulation and not to get volume.   She also said that her NICU RN told her to wait till Tuesday since she didn't get any volume before trying again. One of the NICU RN's already ordered a SW consult for mom due to anxiety. Educated mom about supply and demand and the need of continuous breast stimulation for the onset of lactogenesis II, she voiced understanding.   Feeding plan:  1. Encouraged mom to start pumping every 3 hours, at least 8 pumping sessions/24 hours 2. Once she starts getting drops/EBM she'll be taking those to the NICU for her baby  Parents reported all questions and concerns were answered, they're both aware of [facility] OP services and will call PRN.    Maternal Data Formula Feeding for Exclusion: Yes Reason for exclusion: Mother's choice to formula and breast feed on admission  Feeding    LATCH Score                   Interventions Interventions: Breast feeding basics reviewed  Lactation Tools Discussed/Used     Consult Status Consult Status: PRN Follow-up type: In-patient    Taesean Reth Francene Boyers 01/13/2019, 2:55 PM

## 2019-01-14 ENCOUNTER — Encounter (HOSPITAL_COMMUNITY): Payer: Self-pay | Admitting: *Deleted

## 2019-01-14 LAB — SURGICAL PATHOLOGY

## 2019-01-14 MED ORDER — FERROUS SULFATE 300 (60 FE) MG/5ML PO SYRP: 300.0000 mg | ORAL_SOLUTION | Freq: Two times a day (BID) | ORAL | 1 refills | Status: DC

## 2019-01-14 MED ORDER — ACETAMINOPHEN 325 MG PO TABS: 650.0000 mg | ORAL_TABLET | ORAL | 0 refills | Status: DC | PRN

## 2019-01-14 MED ORDER — BENZOCAINE-MENTHOL 20-0.5 % EX AERO: 1.0000 "application " | INHALATION_SPRAY | CUTANEOUS | 0 refills | Status: DC | PRN

## 2019-01-14 MED ORDER — IBUPROFEN 600 MG PO TABS: 600.0000 mg | ORAL_TABLET | Freq: Four times a day (QID) | ORAL | 0 refills | Status: DC | PRN

## 2019-01-14 MED ORDER — POLYETHYLENE GLYCOL 3350 17 G PO PACK: 17.0000 g | PACK | Freq: Every day | ORAL | 1 refills | Status: AC

## 2019-01-14 MED ORDER — POLYETHYLENE GLYCOL 3350 17 G PO PACK: 17.0000 g | PACK | Freq: Every day | ORAL | Status: DC

## 2019-01-14 NOTE — Lactation Note (Addendum)
This note was copied from a baby's chart. Lactation Consultation Note  Patient Name: Maria Reed [id number] Date: 01/14/2019  Lactation visit with Mom (infant at 49 hrs of age). Mom said that her "breasts are sore" & "nothing is coming out." Mom immediately became tearful b/c she said she wanted infant to receive her milk instead of the DBM.   My lactation student, Iris, & I assisted Mom with pumping. Mom had been using size 27 flanges with pumping. At rest, her nipple diameter suggested size 21s, but we ended up using a size 24 flange on the R breast & a size 27 flange on the L breast. A couple of mL were expressed. The lactation student also worked with Mom on hand expression.   The lactation student showed Mom how to wash & dry her pump parts. Mom understands there is a recommendation to sanitize her pump parts once/day (the boiling option was discussed with Mom).   Colostrum stickers were provided to Mom & we were able to take 4 samples of colostrum to the NICU (2 of which would be good for oral colostrum care).   Mom was leaving after our lactation visit to go to the [facility] office to get her DEBP.   Matthias Hughs [facility] 01/14/2019, 12:25 PM

## 2019-01-14 NOTE — Discharge Instructions (Signed)
Vaginal Delivery, Care After Refer to this sheet in the next few weeks. These discharge instructions provide you with information on caring for yourself after delivery. Your caregiver may also give you specific instructions. Your treatment has been planned according to the most current medical practices available, but problems sometimes occur. Call your caregiver if you have any problems or questions after you go home. HOME CARE INSTRUCTIONS 1. Take over-the-counter or prescription medicines only as directed by your caregiver or pharmacist. 2. Do not drink alcohol, especially if you are breastfeeding or taking medicine to relieve pain. 3. Do not smoke tobacco. 4. Continue to use good perineal care. Good perineal care includes: 1. Wiping your perineum from back to front 2. Keeping your perineum clean. 3. You can do sitz baths twice a day, to help keep this area clean 5. Do not use tampons, douche or have sex until your caregiver says it is okay. 6. Shower only and avoid sitting in submerged water, aside from sitz baths 7. Wear a well-fitting bra that provides breast support. 8. Eat healthy foods. 9. Drink enough fluids to keep your urine clear or pale yellow. 10. Eat high-fiber foods such as whole grain cereals and breads, brown rice, beans, and fresh fruits and vegetables every day. These foods may help prevent or relieve constipation. 11. Avoid constipation with high fiber foods or medications, such as miralax or metamucil 12. Follow your caregiver's recommendations regarding resumption of activities such as climbing stairs, driving, lifting, exercising, or traveling. 13. Talk to your caregiver about resuming sexual activities. Resumption of sexual activities is dependent upon your risk of infection, your rate of healing, and your comfort and desire to resume sexual activity. 14. Try to have someone help you with your household activities and your newborn for at least a few days after you leave  the hospital. 15. Rest as much as possible. Try to rest or take a nap when your newborn is sleeping. 16. Increase your activities gradually. 17. Keep all of your scheduled postpartum appointments. It is very important to keep your scheduled follow-up appointments. At these appointments, your caregiver will be checking to make sure that you are healing physically and emotionally. SEEK MEDICAL CARE IF:   You are passing large clots from your vagina. Save any clots to show your caregiver.  You have a foul smelling discharge from your vagina.  You have trouble urinating.  You are urinating frequently.  You have pain when you urinate.  You have a change in your bowel movements.  You have increasing redness, pain, or swelling near your vaginal incision (episiotomy) or vaginal tear.  You have pus draining from your episiotomy or vaginal tear.  Your episiotomy or vaginal tear is separating.  You have painful, hard, or reddened breasts.  You have a severe headache.  You have blurred vision or see spots.  You feel sad or depressed.  You have thoughts of hurting yourself or your newborn.  You have questions about your care, the care of your newborn, or medicines.  You are dizzy or light-headed.  You have a rash.  You have nausea or vomiting.  You were breastfeeding and have not had a menstrual period within 12 weeks after you stopped breastfeeding.  You are not breastfeeding and have not had a menstrual period by the 12th week after delivery.  You have a fever. SEEK IMMEDIATE MEDICAL CARE IF:   You have persistent pain.  You have chest pain.  You have shortness of breath.    You faint.  You have leg pain.  You have stomach pain.  Your vaginal bleeding saturates two or more sanitary pads in 1 hour. MAKE SURE YOU:   Understand these instructions.  Will watch your condition.  Will get help right away if you are not doing well or get worse. Document Released:  02/19/2000 Document Revised: 07/08/2013 Document Reviewed: 10/19/2011 ExitCare Patient Information 2015 [employer]. This information is not intended to replace advice given to you by your health care provider. Make sure you discuss any questions you have with your health care provider.  Sitz Bath A sitz bath is a warm water bath taken in the sitting position. The water covers only the hips and butt (buttocks). We recommend using one that fits in the toilet, to help with ease of use and cleanliness. It may be used for either healing or cleaning purposes. Sitz baths are also used to relieve pain, itching, or muscle tightening (spasms). The water may contain medicine. Moist heat will help you heal and relax.  HOME CARE  Take 3 to 4 sitz baths a day. 18. Fill the bathtub half-full with warm water. 19. Sit in the water and open the drain a little. 20. Turn on the warm water to keep the tub half-full. Keep the water running constantly. 21. Soak in the water for 15 to 20 minutes. 22. After the sitz bath, pat the affected area dry. GET HELP RIGHT AWAY IF: You get worse instead of better. Stop the sitz baths if you get worse. MAKE SURE YOU:  Understand these instructions.  Will watch your condition.  Will get help right away if you are not doing well or get worse. Document Released: 03/31/2004 Document Revised: 11/16/2011 Document Reviewed: 06/21/2010 ExitCare Patient Information 2015 [employer]. This information is not intended to replace advice given to you by your health care provider. Make sure you discuss any questions you have with your health care provider.    

## 2019-01-28 ENCOUNTER — Ambulatory Visit: Payer: Self-pay

## 2019-01-28 NOTE — Lactation Note (Signed)
This note was copied from a baby's chart. Lactation Consultation Note  Patient Name: Maria Reed [id number] Date: 01/28/2019 Reason for consult: Follow-up assessment  RN paged for [facility] assist: mother had questions about a lump around her breast  Upon arrival mother was holding baby on her chest; baby on CPAP.  He was sleeping.  Mother had noticed a hard lump in between her breasts, more on the left inner aspect of her chest.  Upon palpation I noted the lump to be firm and extended slightly downward to another small firm lump.  It appears to possibly be a plugged milk duct.    Suggested mother use a warm compress and perform massage around breast area towards the lump.  She can massage the lump as long as it is not too painful.  Mother denies pain at this time.  There is no redness or streaking and no flu like symptoms or fever.  We discussed mastitis as a condition that sometimes develops with continued plugged ducts.  Mother has been pumping every three hours but goes 4 hours during the night.  Encouraged her to pump every 2-3 hours and to use a "hands free" bra or sports bra so she can have 2 free hands to perform massage and breast compressions during pumping.  Mother will incorporate this into her plan.  Suggested she talk with her dr. about possibly taking lecithin as a supplement and suggested appropriate foods that may help with this.  Asked her to let us know when she visits if her condition does not improve.  She will monitor and call for further assistance as needed.    Mother enjoyed talking about her birth experience.  I enjoyed listening and provided emotional support.  Informed her that lactation services will be available when baby is ready to latch and breast feed.     Maternal Data    Feeding Feeding Type: Breast Milk  LATCH Score                   Interventions    Lactation Tools Discussed/Used     Consult Status Consult Status: PRN Date:  01/28/19 Follow-up type: Call as needed    Jaynie Hitch R Shawna Wearing 01/28/2019, 4:59 PM

## 2019-01-29 ENCOUNTER — Ambulatory Visit: Payer: Self-pay

## 2019-01-29 NOTE — Lactation Note (Signed)
This note was copied from a baby's chart. Lactation Consultation Note  Patient Name: Maria Reed [id number] Date: 01/29/2019 Reason for consult: Follow-up assessment  LC Follow Up Visit:  Followed up with mother today to determine if she has been able to alleviate the plugged milk duct.  Mother was excited to report that her lump was gone.  I asked permission to assess and mother agreed.  I felt no more lumps.  Encouraged her to continue assessing her breasts and to continue hand expression before/after pumping to help increase milk supply.  Praised her for being diligent and following the plan of care we created yesterday.  Mother will call for further questions/concerns.    Maternal Data    Feeding Feeding Type: Breast Milk  LATCH Score                   Interventions    Lactation Tools Discussed/Used     Consult Status Consult Status: PRN Date: 01/29/19 Follow-up type: Call as needed    Chantalle Defilippo R Leeta Grimme 01/29/2019, 2:24 PM

## 2019-01-30 ENCOUNTER — Ambulatory Visit: Payer: Self-pay

## 2019-01-30 NOTE — Lactation Note (Signed)
This note was copied from a baby's chart. Lactation Consultation Note  Patient Name: Maria Reed [id number] Date: 01/30/2019 Reason for consult: Follow-up assessment;NICU baby;Infant < 6lbs;Nipple pain/trauma;Engorgement;Other (Comment)(LC assessed - borderline engorgement)  Baby is 55 weeks old  LC was called from NICU due to sore nipples, and breast soreness.  Mom mentioned it was 3 hours since she had pumped and last week she  Was able to pump off up to 40 ml and the volume has decreased .  LC assessed both breast with moms permission and noted both breast to be full and lateral areas with full to borderline engorged and warm .  LC assisted mom to set up the DEBP and watched mom pump both breast ,  And the EBM yield was greater on the right than the left 40 ml and right 25 ml.  Mom is using a #24 F on the left and #27 F on the right. Both flanges appeared to be comfortable for mom and in appearance.  LC fixed ice packs for mom and recommended for mom to ice for 15 -20 mins and then pump for 10 - 15 mins and soften breast like pre-pregnancy breast.  LC stressed the importance of listening to her body ( Breast ) and when filling to pump and not wait until they are overly full and uncomfortable.  LC reviewed supply and demand/ importance of pumping both breast for 15 -20 mins on the maintenance mode increasing the suction with her comfort 8 - 10 times  A day and hand expressing.  LC reviewed the cleaning of her pump pieces.  LC stressed the importance if having any problems with engorgement to ask  Her NICU RN for ice for her re- useable ice packs.  Mom aware she can ask her NICU RN to call for [facility] if needed.    Maternal Data Has patient been taught Hand Expression?: Yes  Feeding    LATCH Score                   Interventions Interventions: Breast feeding basics reviewed;DEBP  Lactation Tools Discussed/Used Tools: Pump;Flanges Flange Size: 24;27(# 27 on the left / #24  F on the rght) Breast pump type: Double-Electric Breast Pump Pump Review: Milk Storage;Setup, frequency, and cleaning(LC reviewed)   Consult Status Consult Status: PRN Date: (baby in NICU) Follow-up type: In-patient    [place] 01/30/2019, 4:21 PM

## 2019-02-07 ENCOUNTER — Ambulatory Visit: Payer: Self-pay

## 2019-02-07 NOTE — Lactation Note (Signed)
This note was copied from a baby's chart. Lactation Consultation Note  Patient Name: Maria Reed [id number] Date: 02/07/2019 Reason for consult: Follow-up assessment;Preterm <34wks;Infant < 6lbs;NICU baby  [place] visited with Mom in NICU.  Baby 7 weeks old AGA [redacted]w[redacted]d.  Baby on SiPAP at 21%, and on continuous tube feedings.  Mom encouraged to pump both breasts >8 times per 24 hrs.  Mom states she is trying to.  Baby has been getting donor milk for 2 days now.  Talked to Mom about how important her expressed breast milk is to her baby.  Donor milk is second best, and then formula third.  The antibodies and live immunological factors in her milk will help baby continue to grow and thrive.   LC washed, rinsed and placed in separate bin all the pump parts.  Mom states she had pumped 3 times this am and only rinsed parts.  Encouraged Mom to wash, rinse and air dry pump parts after each use.   Hand's free pumping bra made out of mesh panties for Mom.  Encouraged her to massage and compress her breasts during pumping.    Mom knows she can call lactation prn.  Consult Status Consult Status: Follow-up Date: 02/11/19 Follow-up type: In-patient    [name] 02/07/2019, 2:12 PM

## 2019-02-18 ENCOUNTER — Ambulatory Visit (INDEPENDENT_AMBULATORY_CARE_PROVIDER_SITE_OTHER): Payer: Medicaid Other | Admitting: Obstetrics and Gynecology

## 2019-02-18 ENCOUNTER — Encounter: Payer: Self-pay | Admitting: Obstetrics and Gynecology

## 2019-02-18 ENCOUNTER — Other Ambulatory Visit: Payer: Self-pay

## 2019-02-18 DIAGNOSIS — Z3202 Encounter for pregnancy test, result negative: Secondary | ICD-10-CM

## 2019-02-18 DIAGNOSIS — Z1389 Encounter for screening for other disorder: Secondary | ICD-10-CM | POA: Diagnosis not present

## 2019-02-18 DIAGNOSIS — Z3042 Encounter for surveillance of injectable contraceptive: Secondary | ICD-10-CM

## 2019-02-18 LAB — POCT URINE PREGNANCY: Preg Test, Ur: NEGATIVE

## 2019-02-18 MED ORDER — MEDROXYPROGESTERONE ACETATE 150 MG/ML IM SUSP: 150.0000 mg | INTRAMUSCULAR | 5 refills | Status: DC

## 2019-02-18 MED ORDER — MEDROXYPROGESTERONE ACETATE 150 MG/ML IM SUSP
150.0000 mg | Freq: Once | INTRAMUSCULAR | Status: AC
  Administered 2019-02-18: 150 mg via INTRAMUSCULAR

## 2019-02-18 NOTE — Progress Notes (Signed)
Post Partum Exam  Maria Reed is a 38 y.o. [phone] female who presents for a postpartum visit. She is 6 weeks postpartum following a spontaneous vaginal delivery. I have fully reviewed the prenatal and intrapartum course. The delivery was at 27 gestational weeks.  Anesthesia: epidural. Postpartum course has been uncomplicated. Baby's course has been NICU. Baby is feeding by breast. Bleeding no bleeding. Bowel function is normal. Bladder function is normal. Patient is not sexually active. Contraception method is none. Postpartum depression screening:neg     Review of Systems Pertinent items noted in HPI and remainder of comprehensive ROS otherwise negative.    Objective:  Blood pressure 117/73, pulse 96, height 5\' 2"  (1.575 m), weight 106 lb 14.4 oz (48.5 kg), last menstrual period 07/01/2018, currently breastfeeding.  General:  alert, cooperative and no distress   Breasts:  inspection negative, no nipple discharge or bleeding, no masses or nodularity palpable  Lungs: clear to auscultation bilaterally  Heart:  regular rate and rhythm  Abdomen: soft, non-tender; bowel sounds normal; no masses,  no organomegaly   Vulva:  normal  Vagina: normal vagina, no discharge, exudate, lesion, or erythema  Cervix:  multiparous appearance  Corpus: normal size, contour, position, consistency, mobility, non-tender  Adnexa:  no mass, fullness, tenderness  Rectal Exam: Not performed.        Assessment:    Normal postpartum exam. Pap smear not done at today's visit.   Plan:   1. Contraception: Depo-Provera injections until BTL. BTl form signed today 2. Patient is medically cleared to resume all activities of daily living.  3. Follow up in: 6 months for annual exam or as needed.

## 2019-02-18 NOTE — Addendum Note (Signed)
Addended by: Lucianne Lei on: 02/18/2019 10:35 AM   Modules accepted: Orders

## 2019-02-22 ENCOUNTER — Ambulatory Visit: Payer: Self-pay

## 2019-02-22 NOTE — Lactation Note (Signed)
This note was copied from a baby's chart. Lactation Consultation Note  Patient Name: Maria Reed Cloyd [id number] Date: 02/22/2019   Baby 54 weeks old and mother has increased her pumping frequency and now is pumping 120-200 ml.  Provided mother with additional bottles for bringing milk to NICU. She is using #27 flanges and using coconut oil which is helping. Discussed how to adjust suction.  Praised her for her efforts. No further questions at this time.        Maternal Data    Feeding Feeding Type: Breast Milk  LATCH Score                   Interventions    Lactation Tools Discussed/Used     Consult Status      Carlye Grippe 02/22/2019, 2:23 PM

## 2019-02-23 ENCOUNTER — Ambulatory Visit: Payer: Self-pay

## 2019-02-23 NOTE — Lactation Note (Signed)
This note was copied from a baby's chart. Lactation Consultation Note  Patient Name: Maria Reed [id number] Date: 02/23/2019 Reason for consult: Follow-up assessment;NICU baby;Preterm <34wks  Visited with mom of a 20 weeks old pre-term NICU female, she told LC she's pumping about 4 oz. of EBM combined per pumping session; she's not keeping track of "how many times a day" she's pumping, mom said: "I just pump whenever my breasts feel full" she's not setting an alarm at night either, but her breast will wake her up if she needs to pump.   Explained to mom the importance of consistent pumping and praised for her efforts of proving her breast milk to her baby. She told LC she hasn't called the [facility] office regarding returning her pump, the paper said she needs to return it within 6 weeks by 02/27/2019. Mom already has a Medela DEBP at home that is portable, she may be referring to the [facility].   Mom voiced she's getting about the same output with the hospital grade pump Vs her Medela at home, so she's just going to return the pump to [facility] and keep pumping with her personal pump, she's also using coconut oil prior pumping for added lubrication and doing breast massage prior/during pumping to make sure she's completely emptying the breast, she has a plan and so far it's working, she's pumping to volume; baby is on 100% mother's milk.  Encouraged mom to pump consistently ideally every 3 hours but she may go at her own pace. She reported all questions and concerns were answered, she's aware of [place] OP services and will call PRN.  Maternal Data    Feeding    LATCH Score                   Interventions Interventions: Breast feeding basics reviewed;Coconut oil;DEBP  Lactation Tools Discussed/Used Tools: Pump;Coconut oil Breast pump type: Double-Electric Breast Pump   Consult Status Consult Status: PRN Follow-up type: Call as needed    Harry Bark S Sara Selvidge 02/23/2019, 4:08  PM

## 2019-02-26 ENCOUNTER — Ambulatory Visit: Payer: Self-pay

## 2019-02-26 NOTE — Lactation Note (Signed)
This note was copied from a baby's chart. Lactation Consultation Note  Patient Name: Maria Reed [id number] Date: 02/26/2019   Mother pumping upon entering.   Mother has been doing a great job with pumping. She pumps more frequently during the day q 2-2.5 hours and q 4-5 during the night. She pumps between 0230 and 0330. Praised her for her efforts. She often has full milk ducts (knots). Knots resolve with pumping. Provided education that these are not always plugged milk ducts if simply massaging they go away with next session or two. Assisted w/ massaging full milk duct on R breast. No further questions at this time.       Vivianne Master Boschen 02/26/2019, 1:38 PM      Maternal Data    Feeding Feeding Type: Breast Milk with Formula added  LATCH Score                   Interventions    Lactation Tools Discussed/Used     Consult Status      Carlye Grippe 02/26/2019, 1:38 PM

## 2019-03-02 ENCOUNTER — Ambulatory Visit: Payer: Self-pay

## 2019-03-02 NOTE — Lactation Note (Signed)
This note was copied from a baby's chart. Lactation Consultation Note  Patient Name: Boy Felisity Botkins [id number] Date: 03/02/2019 Reason for consult: Follow-up assessment;NICU baby   Mom holding infant.  States she recently pumped 12 bottles in the last couple of days.  She is waking to pump 2 times a night.  She denies discomfort when pumping.  She returns her [place] next week and has purchased a [place] for home use.  LC encouraged her to continue to use hospital grade pump when visiting [place]in the NICU.  Mom praised [place] that helped her with helping clear full milk duct when pumping a few days ago.    LC praised mom for efforts and for commitment to provide infant with breastmilk.  All questions answered and mom denies any needs at this time.     Maternal Data    Feeding    LATCH Score                   Interventions Interventions: DEBP  Lactation Tools Discussed/Used     Consult Status Consult Status: PRN Date: 03/03/19 Follow-up type: In-patient    Ferne Coe [facility] 03/02/2019, 12:04 PM

## 2019-03-08 ENCOUNTER — Ambulatory Visit: Payer: Self-pay

## 2019-03-08 NOTE — Lactation Note (Signed)
This note was copied from a baby's chart. Lactation Consultation Note  Patient Name: Maria Reed [id number] Date: 03/08/2019   LC in to visit with P45 Mom of 38 week old baby AGA of [redacted]w[redacted]d.  Mom is doing well pumping, milk supply is good.  Baby sleeping STS on Mom's chest.  Baby is being fed continuous gavage.    Mom denies having any questions currently.     [name] 03/08/2019, 12:48 PM

## 2019-03-12 ENCOUNTER — Ambulatory Visit: Payer: Self-pay

## 2019-03-12 NOTE — Lactation Note (Signed)
This note was copied from a baby's chart. Lactation Consultation Note  Patient Name: Maria Reed [id number] Date: 03/12/2019   Infant is 28 weeks old with a CGA of [redacted]w[redacted]d. Mom has no questions, but did comment that she is able to almost 4 oz every time she pumps. She is able to produce about 28-36 oz/day, according to Mom's description.   Matthias Hughs [facility] 03/12/2019, 1:31 PM

## 2019-03-19 ENCOUNTER — Ambulatory Visit: Payer: Self-pay

## 2019-03-19 NOTE — Lactation Note (Signed)
This note was copied from a baby's chart. Lactation Consultation Note  Patient Name: Maria Reed [id number] Date: 03/19/2019   Mom was at bedside. Mom says that "everything is going good" and she has not had any more issues with plugged ducts. Mom continues to pump about 4 oz/session.   Matthias Hughs [facility] 03/19/2019, 12:45 PM

## 2019-03-20 ENCOUNTER — Ambulatory Visit: Payer: Self-pay

## 2019-03-20 NOTE — Lactation Note (Signed)
This note was copied from a baby's chart. Lactation Consultation Note  Patient Name: Maria Reed [id number] Date: 03/20/2019 Reason for consult: Follow-up assessment;[place];1st time breastfeeding;Infant < 6lbs;Early term 37-38.6wks  LC in to visit with P1 Mom of [redacted]w[redacted]d AGA baby at 70 months old.  Baby had a swallow study today, and has started "paci-dips".    Mom is pumping 7 times a day and expressing 2-4 oz per pumping.    Mom desires to direct breastfeed her baby when able.  Encouraged STS with baby as much as possible.  Encouraged pumping at the bedside, Mom using [place] and will bring it with her tomorrow.    Talked about pre-pumping for 5-10 mins, prior to latching baby to the breast, to assure baby can get to her higher calorie milk called hind milk.    Mom encouraged to have her RN call lactation when allowed to latch baby to the breast.   Consult Status Consult Status: Follow-up Date: 03/21/19 Follow-up type: In-patient    [name] 03/20/2019, 2:00 PM

## 2019-04-10 ENCOUNTER — Encounter (HOSPITAL_BASED_OUTPATIENT_CLINIC_OR_DEPARTMENT_OTHER): Payer: Self-pay | Admitting: Obstetrics and Gynecology

## 2019-04-10 ENCOUNTER — Other Ambulatory Visit: Payer: Self-pay

## 2019-04-11 ENCOUNTER — Other Ambulatory Visit: Payer: Self-pay | Admitting: Obstetrics and Gynecology

## 2019-04-15 ENCOUNTER — Ambulatory Visit: Payer: Self-pay

## 2019-04-15 NOTE — Lactation Note (Signed)
This note was copied from a baby's chart. Lactation Consultation Note  Patient Name: Maria Reed [id number] Date: 04/15/2019 Reason for consult: Follow-up assessment;NICU baby Attempted another latch after a partial bottle feeding.  Baby continues to be very fussy and wont accept the breast.  Reassured mom.  Baby may need to have almost full gavage feeding before going to breast.  Maternal Data    Feeding Feeding Type: Bottle Fed - Breast Milk Nipple Type: Other(Parent's choice fast flow wide base)  LATCH Score Latch: Too sleepy or reluctant, no latch achieved, no sucking elicited.  Audible Swallowing: None  Type of Nipple: Everted at rest and after stimulation  Comfort (Breast/Nipple): Soft / non-tender  Hold (Positioning): Assistance needed to correctly position infant at breast and maintain latch.  LATCH Score: 5  Interventions    Lactation Tools Discussed/Used Tools: Nipple Shields Nipple shield size: 24   Consult Status Consult Status: PRN    Carla Drape S 04/15/2019, 2:55 PM

## 2019-04-15 NOTE — Lactation Note (Signed)
This note was copied from a baby's chart. Lactation Consultation Note  Patient Name: Maria Reed [id number] Date: 04/15/2019 Reason for consult: Follow-up assessment;NICU baby    LC Follow Up Question:  Mother questioning whether or not she will have to "pump and dump" her breast milk after her tubal scheduled for 04/17/19.  She thought that is what she was told.  RN called for my assistance and I phoned the [facility] and spoke with the RN responsible for that area.  She informed me that mother would be under general anesthesia and that mother should specifically speak with the anesthesiologist doing the surgery that day.  Information relayed to RN and she will inform mother.  It is unlikely mother will have to "pump and dump" after surgery.   Consult Status Consult Status: PRN    Little Ishikawa 04/15/2019, 4:34 PM

## 2019-04-15 NOTE — Lactation Note (Signed)
This note was copied from a baby's chart. Lactation Consultation Note  Patient Name: Maria Reed [id number] Date: 04/15/2019 Reason for consult: Follow-up assessment;NICU baby Mom is ready to attempt breastfeeding baby.  She last pumped 3 hours ago.  She usually pumps 30 mls from each breast.  Baby is currently fussy and sucking vigorously on pacifier.  Positioned baby in cross cradle hold. Mom has erect nipples.  Baby very fussy at breast and arching.  Oxygen sats down to low 80's with good recovery when comforted and given paci.  24 mm nipple shield applied.  Baby continues to be frantic and no suck elicited.  Feeding attempt discontinued.  Speech arrived to work with bottle feeding.  May attempt again after baby has fed an ounce.  Maternal Data    Feeding Feeding Type: Breast Fed Nipple Type: Dr. Clement Husbands  LATCH Score Latch: Too sleepy or reluctant, no latch achieved, no sucking elicited.  Audible Swallowing: None  Type of Nipple: Everted at rest and after stimulation  Comfort (Breast/Nipple): Soft / non-tender  Hold (Positioning): Assistance needed to correctly position infant at breast and maintain latch.  LATCH Score: 5  Interventions    Lactation Tools Discussed/Used Tools: Nipple Shields Nipple shield size: 24   Consult Status Consult Status: PRN    Carla Drape S 04/15/2019, 2:31 PM

## 2019-04-15 NOTE — Lactation Note (Signed)
This note was copied from a baby's chart. Lactation Consultation Note  Patient Name: Maria Reed [id number] Date: 04/15/2019 Reason for consult: Follow-up assessment;Preterm <34wks Baby is 3 months old in the NICU and 41.1 PMA.  Mom states pumping is going well and she has an abundant supply.  Mom is very anxious to put baby to breast.  I called [name] NP for orders.  Order was received to allow baby to go to breast.  Plan on following up at next feeding.  Maternal Data    Feeding Feeding Type: Breast Fed Nipple Type: Dr. Clement Husbands  [facility] Score                   Interventions    Lactation Tools Discussed/Used     Consult Status      [name] 04/15/2019, 12:15 PM

## 2019-04-16 ENCOUNTER — Other Ambulatory Visit (HOSPITAL_COMMUNITY)
Admission: RE | Admit: 2019-04-16 | Discharge: 2019-04-16 | Disposition: A | Discharge status: Home / Self Care | Payer: Medicaid Other | Source: Ambulatory Visit | Attending: Obstetrics and Gynecology | Admitting: Obstetrics and Gynecology

## 2019-04-16 DIAGNOSIS — Z01812 Encounter for preprocedural laboratory examination: Secondary | ICD-10-CM | POA: Insufficient documentation

## 2019-04-16 DIAGNOSIS — Z20822 Contact with and (suspected) exposure to covid-19: Secondary | ICD-10-CM | POA: Diagnosis not present

## 2019-04-16 LAB — SARS CORONAVIRUS 2 (TAT 6-24 HRS): SARS Coronavirus 2: NEGATIVE

## 2019-04-16 NOTE — H&P (Signed)
Maria Reed is an 39 y.o. female P1 here for scheduled laparoscopic bilateral tubal ligation. Patient s/p vaginal delivery 02/2019 now with undesired fertility. Patient is without complaints. She has been using depo-provera for contraception. Patient denies any vaginal bleeding. Patient is still breastfeeding  Pertinent Gynecological History: Contraception: Depo-Provera injections DES exposure: denies Blood transfusions: none Last pap: normal Date: 09/2018 OB History: G3, P0121   Menstrual History: Patient's last menstrual period was 04/03/2019.    Past Medical History:  Diagnosis Date  . Cervical cancer ([place]) 2017    Past Surgical History:  Procedure Laterality Date  . CERVIX SURGERY      History reviewed. No pertinent family history.  Social History:  reports that she has never smoked. She has never used smokeless tobacco. She reports that she does not drink alcohol or use drugs.  Allergies: No Known Allergies  Medications Prior to Admission  Medication Sig Dispense Refill Last Dose  . Iron-Vitamins (GERITOL COMPLETE PO) Take 15 mLs by mouth every Monday, Wednesday, and Friday.    04/10/2019 at Unknown time  . medroxyPROGESTERone (DEPO-PROVERA) 150 MG/ML injection Inject 1 mL (150 mg total) into the muscle every 3 (three) months. 1 mL 5 Past Month at Unknown time    Review of Systems  See pertinent in HPI  Blood pressure 97/67, pulse 92, temperature 98.4 F (36.9 C), temperature source Temporal, height 5\' 2"  (1.575 m), weight 48.7 kg, last menstrual period 04/03/2019, SpO2 100 %, currently breastfeeding. Physical Exam GENERAL: Well-developed, well-nourished female in no acute distress.  LUNGS: Clear to auscultation bilaterally.  HEART: Regular rate and rhythm. ABDOMEN: Soft, nontender, nondistended. No organomegaly. PELVIC: Deferred to OR EXTREMITIES: No cyanosis, clubbing, or edema, 2+ distal pulses.  Results for orders placed or performed during the hospital  encounter of 04/17/19 (from the past 24 hour(s))  Pregnancy, urine POC     Status: None   Collection Time: 04/17/19  7:09 AM  Result Value Ref Range   Preg Test, Ur NEGATIVE NEGATIVE    No results found.  Assessment/Plan:  INDICATIONS: 39 y.o. [id number] who desires permanent sterilization. Other reversible forms of contraception were discussed with patient; she declines all other modalities.  Risks of procedure discussed with patient including permanence of method, bleeding, infection, injury to surrounding organs and need for additional procedures including laparotomy, risk of regret.  Failure risk of 0.5-1% with increased risk of ectopic gestation if pregnancy occurs was also discussed with patient.     Shermon Bozzi 04/17/2019, 8:20 AM

## 2019-04-17 ENCOUNTER — Ambulatory Visit (HOSPITAL_BASED_OUTPATIENT_CLINIC_OR_DEPARTMENT_OTHER)
Admission: RE | Admit: 2019-04-17 | Discharge: 2019-04-17 | Disposition: A | Discharge status: Home / Self Care | Payer: Medicaid Other | Attending: Obstetrics and Gynecology | Admitting: Obstetrics and Gynecology

## 2019-04-17 ENCOUNTER — Ambulatory Visit (HOSPITAL_BASED_OUTPATIENT_CLINIC_OR_DEPARTMENT_OTHER): Payer: Medicaid Other | Admitting: Certified Registered"

## 2019-04-17 ENCOUNTER — Encounter (HOSPITAL_BASED_OUTPATIENT_CLINIC_OR_DEPARTMENT_OTHER)
Admission: RE | Disposition: A | Discharge status: Home / Self Care | Payer: Self-pay | Source: Home / Self Care | Attending: Obstetrics and Gynecology

## 2019-04-17 DIAGNOSIS — Z793 Long term (current) use of hormonal contraceptives: Secondary | ICD-10-CM | POA: Diagnosis not present

## 2019-04-17 DIAGNOSIS — Z8541 Personal history of malignant neoplasm of cervix uteri: Secondary | ICD-10-CM | POA: Diagnosis not present

## 2019-04-17 DIAGNOSIS — Z302 Encounter for sterilization: Secondary | ICD-10-CM | POA: Insufficient documentation

## 2019-04-17 HISTORY — PX: LAPAROSCOPIC TUBAL LIGATION: SHX1937

## 2019-04-17 LAB — POCT PREGNANCY, URINE: Preg Test, Ur: NEGATIVE

## 2019-04-17 SURGERY — LIGATION, FALLOPIAN TUBE, LAPAROSCOPIC
Anesthesia: General | Site: Abdomen | Laterality: Bilateral

## 2019-04-17 MED ORDER — ONDANSETRON HCL 4 MG/2ML IJ SOLN
INTRAMUSCULAR | Status: DC | PRN
  Administered 2019-04-17: 4 mg via INTRAVENOUS

## 2019-04-17 MED ORDER — BUPIVACAINE HCL (PF) 0.25 % IJ SOLN
INTRAMUSCULAR | Status: DC | PRN
  Administered 2019-04-17: 10 mL

## 2019-04-17 MED ORDER — IBUPROFEN 600 MG PO TABS: 600.0000 mg | ORAL_TABLET | Freq: Four times a day (QID) | ORAL | 3 refills | Status: AC | PRN

## 2019-04-17 MED ORDER — BUPIVACAINE HCL (PF) 0.25 % IJ SOLN
INTRAMUSCULAR | Status: AC
  Filled 2019-04-17: qty 30

## 2019-04-17 MED ORDER — ONDANSETRON HCL 4 MG/2ML IJ SOLN: 4.0000 mg | Freq: Once | INTRAMUSCULAR | Status: DC | PRN

## 2019-04-17 MED ORDER — MEPERIDINE HCL 25 MG/ML IJ SOLN: 6.2500 mg | INTRAMUSCULAR | Status: DC | PRN

## 2019-04-17 MED ORDER — SUGAMMADEX SODIUM 200 MG/2ML IV SOLN
INTRAVENOUS | Status: DC | PRN
  Administered 2019-04-17: 200 mg via INTRAVENOUS

## 2019-04-17 MED ORDER — PROPOFOL 500 MG/50ML IV EMUL
INTRAVENOUS | Status: AC
  Filled 2019-04-17: qty 50

## 2019-04-17 MED ORDER — MIDAZOLAM HCL 2 MG/2ML IJ SOLN
INTRAMUSCULAR | Status: AC
  Filled 2019-04-17: qty 2

## 2019-04-17 MED ORDER — PROPOFOL 10 MG/ML IV BOLUS
INTRAVENOUS | Status: DC | PRN
  Administered 2019-04-17: 120 mg via INTRAVENOUS

## 2019-04-17 MED ORDER — HYDROMORPHONE HCL 1 MG/ML IJ SOLN: 0.2500 mg | INTRAMUSCULAR | Status: DC | PRN

## 2019-04-17 MED ORDER — LIDOCAINE 2% (20 MG/ML) 5 ML SYRINGE
INTRAMUSCULAR | Status: AC
  Filled 2019-04-17: qty 5

## 2019-04-17 MED ORDER — ROCURONIUM BROMIDE 10 MG/ML (PF) SYRINGE
PREFILLED_SYRINGE | INTRAVENOUS | Status: AC
  Filled 2019-04-17: qty 10

## 2019-04-17 MED ORDER — DEXAMETHASONE SODIUM PHOSPHATE 10 MG/ML IJ SOLN
INTRAMUSCULAR | Status: DC | PRN
  Administered 2019-04-17: 10 mg via INTRAVENOUS

## 2019-04-17 MED ORDER — OXYCODONE-ACETAMINOPHEN 5-325 MG PO TABS: 1.0000 | ORAL_TABLET | Freq: Four times a day (QID) | ORAL | 0 refills | Status: AC | PRN

## 2019-04-17 MED ORDER — FENTANYL CITRATE (PF) 100 MCG/2ML IJ SOLN
INTRAMUSCULAR | Status: DC | PRN
  Administered 2019-04-17: 100 ug via INTRAVENOUS

## 2019-04-17 MED ORDER — ONDANSETRON HCL 4 MG/2ML IJ SOLN
INTRAMUSCULAR | Status: AC
  Filled 2019-04-17: qty 2

## 2019-04-17 MED ORDER — DOCUSATE SODIUM 100 MG PO CAPS: 100.0000 mg | ORAL_CAPSULE | Freq: Two times a day (BID) | ORAL | 2 refills | Status: AC | PRN

## 2019-04-17 MED ORDER — KETOROLAC TROMETHAMINE 30 MG/ML IJ SOLN
INTRAMUSCULAR | Status: DC | PRN
  Administered 2019-04-17: 30 mg via INTRAVENOUS

## 2019-04-17 MED ORDER — SILVER NITRATE-POT NITRATE 75-25 % EX MISC
CUTANEOUS | Status: AC
  Filled 2019-04-17: qty 10

## 2019-04-17 MED ORDER — DEXAMETHASONE SODIUM PHOSPHATE 10 MG/ML IJ SOLN
INTRAMUSCULAR | Status: AC
  Filled 2019-04-17: qty 1

## 2019-04-17 MED ORDER — LACTATED RINGERS IV SOLN: INTRAVENOUS | Status: DC

## 2019-04-17 MED ORDER — ROCURONIUM BROMIDE 50 MG/5ML IV SOSY
PREFILLED_SYRINGE | INTRAVENOUS | Status: DC | PRN
  Administered 2019-04-17: 75 mg via INTRAVENOUS

## 2019-04-17 MED ORDER — FENTANYL CITRATE (PF) 100 MCG/2ML IJ SOLN
INTRAMUSCULAR | Status: AC
  Filled 2019-04-17: qty 2

## 2019-04-17 MED ORDER — LIDOCAINE 2% (20 MG/ML) 5 ML SYRINGE
INTRAMUSCULAR | Status: DC | PRN
  Administered 2019-04-17: 100 mg via INTRAVENOUS

## 2019-04-17 MED ORDER — MIDAZOLAM HCL 2 MG/2ML IJ SOLN
INTRAMUSCULAR | Status: DC | PRN
  Administered 2019-04-17: 2 mg via INTRAVENOUS

## 2019-04-17 SURGICAL SUPPLY — 30 items
CATH ROBINSON RED A/P 16FR (CATHETERS) IMPLANT
COVER MAYO STAND STRL (DRAPES) IMPLANT
DECANTER SPIKE VIAL GLASS SM (MISCELLANEOUS) ×3 IMPLANT
DERMABOND ADVANCED (GAUZE/BANDAGES/DRESSINGS) ×2
DERMABOND ADVANCED .7 DNX12 (GAUZE/BANDAGES/DRESSINGS) ×1 IMPLANT
DRSG OPSITE POSTOP 3X4 (GAUZE/BANDAGES/DRESSINGS) ×3 IMPLANT
DURAPREP 26ML APPLICATOR (WOUND CARE) ×3 IMPLANT
GAUZE 4X4 16PLY RFD (DISPOSABLE) ×3 IMPLANT
GLOVE BIOGEL PI IND STRL 6.5 (GLOVE) ×2 IMPLANT
GLOVE BIOGEL PI IND STRL 7.0 (GLOVE) ×2 IMPLANT
GLOVE BIOGEL PI INDICATOR 6.5 (GLOVE) ×4
GLOVE BIOGEL PI INDICATOR 7.0 (GLOVE) ×4
GLOVE SURG SS PI 6.0 STRL IVOR (GLOVE) ×3 IMPLANT
GOWN STRL REUS W/TWL LRG LVL3 (GOWN DISPOSABLE) ×6 IMPLANT
NS IRRIG 1000ML POUR BTL (IV SOLUTION) ×3 IMPLANT
PACK LAPAROSCOPY BASIN (CUSTOM PROCEDURE TRAY) ×3 IMPLANT
PACK TRENDGUARD 450 HYBRID PRO (MISCELLANEOUS) ×1 IMPLANT
PACK TRENDGUARD 600 HYBRD PROC (MISCELLANEOUS) IMPLANT
PAD ARMBOARD 7.5X6 YLW CONV (MISCELLANEOUS) IMPLANT
PAD OB MATERNITY 4.3X12.25 (PERSONAL CARE ITEMS) ×3 IMPLANT
PAD PREP 24X48 CUFFED NSTRL (MISCELLANEOUS) ×3 IMPLANT
SET TUBE SMOKE EVAC HIGH FLOW (TUBING) ×3 IMPLANT
SLEEVE SCD COMPRESS KNEE MED (MISCELLANEOUS) ×3 IMPLANT
SUT MON AB 4-0 PS1 27 (SUTURE) ×3 IMPLANT
SUT VICRYL 0 UR6 27IN ABS (SUTURE) ×3 IMPLANT
TOWEL GREEN STERILE FF (TOWEL DISPOSABLE) ×6 IMPLANT
TRENDGUARD 450 HYBRID PRO PACK (MISCELLANEOUS) ×3
TRENDGUARD 600 HYBRID PROC PK (MISCELLANEOUS)
TROCAR BALLN 12MMX100 BLUNT (TROCAR) ×3 IMPLANT
WARMER LAPAROSCOPE (MISCELLANEOUS) ×3 IMPLANT

## 2019-04-17 NOTE — Discharge Instructions (Signed)
Laparoscopic Tubal Ligation, Care After This sheet gives you information about how to care for yourself after your procedure. Your health care provider may also give you more specific instructions. If you have problems or questions, contact your health care provider. What can I expect after the procedure? After the procedure, it is common to have:  A sore throat.  Discomfort in your shoulder.  Mild discomfort or cramping in your abdomen.  Gas pains.  Pain or soreness in the area where the surgical incision was made.  A bloated feeling.  Tiredness.  Nausea.  Vomiting. Follow these instructions at home: Medicines  Take over-the-counter and prescription medicines only as told by your health care provider.  Do not take aspirin because it can cause bleeding.  Ask your health care provider if the medicine prescribed to you: ? Requires you to avoid driving or using heavy machinery. ? Can cause constipation. You may need to take actions to prevent or treat constipation, such as:  Drink enough fluid to keep your urine pale yellow.  Take over-the-counter or prescription medicines.  Eat foods that are high in fiber, such as beans, whole grains, and fresh fruits and vegetables.  Limit foods that are high in fat and processed sugars, such as fried or sweet foods. Incision care      Follow instructions from your health care provider about how to take care of your incision. Make sure you: ? Wash your hands with soap and water before and after you change your bandage (dressing). If soap and water are not available, use hand sanitizer. ? Change your dressing as told by your health care provider. ? Leave stitches (sutures), skin glue, or adhesive strips in place. These skin closures may need to stay in place for 2 weeks or longer. If adhesive strip edges start to loosen and curl up, you may trim the loose edges. Do not remove adhesive strips completely unless your health care provider  tells you to do that.  Check your incision area every day for signs of infection. Check for: ? Redness, swelling, or pain. ? Fluid or blood. ? Warmth. ? Pus or a bad smell. Activity  Rest as told by your health care provider.  Avoid sitting for a long time without moving. Get up to take short walks every 1-2 hours. This is important to improve blood flow and breathing. Ask for help if you feel weak or unsteady.  Return to your normal activities as told by your health care provider. Ask your health care provider what activities are safe for you. General instructions  Do not take baths, swim, or use a hot tub until your health care provider approves. Ask your health care provider if you may take showers. You may only be allowed to take sponge baths.  Have someone help you with your daily household tasks for the first few days.  Keep all follow-up visits as told by your health care provider. This is important. Contact a health care provider if:  You have redness, swelling, or pain around your incision.  Your incision feels warm to the touch.  You have pus or a bad smell coming from your incision.  The edges of your incision break open after the sutures have been removed.  Your pain does not improve after 2-3 days.  You have a rash.  You repeatedly become dizzy or light-headed.  Your pain medicine is not helping. Get help right away if you:  Have a fever.  Faint.  Have increasing   pain in your abdomen.  Have severe pain in one or both of your shoulders.  Have fluid or blood coming from your sutures or from your vagina.  Have shortness of breath or difficulty breathing.  Have chest pain or leg pain.  Have ongoing nausea, vomiting, or diarrhea. Summary  After the procedure, it is common to have mild discomfort or cramping in your abdomen.  Take over-the-counter and prescription medicines only as told by your health care provider.  Watch for symptoms that should  prompt you to call your health care provider.  Keep all follow-up visits as told by your health care provider. This is important. This information is not intended to replace advice given to you by your health care provider. Make sure you discuss any questions you have with your health care provider. Document Revised: 07/31/2018 Document Reviewed: 01/16/2018 Elsevier Patient Education  [place] Instructions  Activity: Get plenty of rest for the remainder of the day. A responsible individual must stay with you for 24 hours following the procedure.  For the next 24 hours, DO NOT: -Drive a car -Paediatric nurse -Drink alcoholic beverages -Take any medication unless instructed by your physician -Make any legal decisions or sign important papers.  Meals: Start with liquid foods such as gelatin or soup. Progress to regular foods as tolerated. Avoid greasy, spicy, heavy foods. If nausea and/or vomiting occur, drink only clear liquids until the nausea and/or vomiting subsides. Call your physician if vomiting continues.  Special Instructions/Symptoms: Your throat may feel dry or sore from the anesthesia or the breathing tube placed in your throat during surgery. If this causes discomfort, gargle with warm salt water. The discomfort should disappear within 24 hours.  If you had a scopolamine patch placed behind your ear for the management of post- operative nausea and/or vomiting:  1. The medication in the patch is effective for 72 hours, after which it should be removed.  Wrap patch in a tissue and discard in the trash. Wash hands thoroughly with soap and water. 2. You may remove the patch earlier than 72 hours if you experience unpleasant side effects which may include dry mouth, dizziness or visual disturbances. 3. Avoid touching the patch. Wash your hands with soap and water after contact with the patch.

## 2019-04-17 NOTE — Op Note (Signed)
Maria Reed 04/17/2019  PREOPERATIVE DIAGNOSIS:  Undesired fertility  POSTOPERATIVE DIAGNOSIS:  Undesired fertility  PROCEDURE:  Laparoscopic Bilateral Tubal Sterilization using coagulation   SURGEON: Dr. Mora Bellman  ANESTHESIA:  General endotracheal  COMPLICATIONS:  None immediate.  ESTIMATED BLOOD LOSS:  Less than 20 ml.  FLUIDS: 900 ml LR.   INDICATIONS: 39 y.o. [id number]  with undesired fertility, desires permanent sterilization. Other reversible forms of contraception were discussed with patient; she declines all other modalities.  Risks of procedure discussed with patient including permanence of method, bleeding, infection, injury to surrounding organs and need for additional procedures including laparotomy, risk of regret.  Failure risk of 0.5-1% with increased risk of ectopic gestation if pregnancy occurs was also discussed with patient.      FINDINGS:  Normal uterus, tubes, and ovaries. Evidence of endometriosis involving posterior and anterior cul de sac  TECHNIQUE:  The patient was taken to the operating room where general anesthesia was obtained without difficulty.  She was then placed in the dorsal lithotomy position and prepared and draped in sterile fashion.  After an adequate timeout was performed, a bivalved speculum was then placed in the patient's vagina, and the anterior lip of cervix grasped with the single-tooth tenaculum.  The uterine manipulator was then advanced into the uterus.  The speculum was removed from the vagina.  Attention was then turned to the patient's abdomen where a 10-mm skin incision was made on the umbilical fold.  The underlying fascia was identified, grasped with Kocher clamps, tented up and entered sharply with mayo scissors. The fascia was tagged with 0-Vicryl. The peritoneum was entered bluntly.The 11 mm trocar and sleeve were introduced into the abdominal cavityl.  Intraperitoneal placement was confirmed with the use of the laparoscope.  Pneumoperitoneum was achieved by the insufflation of CO2 gas. A survey of the patient's pelvis and abdomen revealed entirely normal anatomy.  The fallopian tubes were observed and found to be normal in appearance. Bipolar forceps was then advanced through the operative port and used to coagulate a 3-cm portion of the left tube in the mid isthmic area.  Good blanching and coagulation was noted at the site of the application.  There was no bleeding noted in the mesosalpinx.  A similar process was carried out on the right fallopian tube.  Good hemostasis was noted overall. The instruments were then removed from the patient's abdomen and the fascial incision was repaired with 0 Vicryl, and the skin was closed with 4-0 Vicryl. The uterine manipulator and the tenaculum were removed from the vagina without complications. The patient tolerated the procedure well.  Sponge, lap, and needle counts were correct times two.  The patient was then taken to the recovery room awake, extubated and in stable  in stable condition.

## 2019-04-17 NOTE — Transfer of Care (Signed)
Immediate Anesthesia Transfer of Care Note  Patient: Maria Reed  Procedure(s) Performed: LAPAROSCOPIC TUBAL LIGATION (Bilateral Abdomen)  Patient Location: PACU  Anesthesia Type:General  Level of Consciousness: drowsy and patient cooperative  Airway & Oxygen Therapy: Patient Spontanous Breathing and Patient connected to face mask oxygen  Post-op Assessment: Report given to RN and Post -op Vital signs reviewed and stable  Post vital signs: Reviewed and stable  Last Vitals:  Vitals Value Taken Time  BP 100/67 04/17/19 0930  Temp    Pulse 96 04/17/19 0931  Resp 17 04/17/19 0931  SpO2 100 % 04/17/19 0931  Vitals shown include unvalidated device data.  Last Pain:  Vitals:   04/17/19 0740  TempSrc: Temporal  PainSc: 0-No pain         Complications: No apparent anesthesia complications

## 2019-04-17 NOTE — Anesthesia Procedure Notes (Signed)
Procedure Name: Intubation Date/Time: 04/17/2019 8:44 AM Performed by: Genelle Bal, CRNA Pre-anesthesia Checklist: Patient identified, Emergency Drugs available, Suction available and Patient being monitored Patient Re-evaluated:Patient Re-evaluated prior to induction Oxygen Delivery Method: Circle system utilized Preoxygenation: Pre-oxygenation with 100% oxygen Induction Type: IV induction Ventilation: Mask ventilation without difficulty Laryngoscope Size: Miller and 2 Grade View: Grade I Tube type: Oral Tube size: 7.0 mm Number of attempts: 1 Airway Equipment and Method: Stylet and Oral airway Placement Confirmation: ETT inserted through vocal cords under direct vision,  positive ETCO2 and breath sounds checked- equal and bilateral Secured at: 20 cm Tube secured with: Tape Dental Injury: Teeth and Oropharynx as per pre-operative assessment

## 2019-04-17 NOTE — Anesthesia Postprocedure Evaluation (Signed)
Anesthesia Post Note  Patient: Scientist, product/process development  Procedure(s) Performed: LAPAROSCOPIC TUBAL LIGATION (Bilateral Abdomen)     Patient location during evaluation: PACU Anesthesia Type: General Level of consciousness: awake and alert Pain management: pain level controlled Vital Signs Assessment: post-procedure vital signs reviewed and stable Respiratory status: spontaneous breathing, nonlabored ventilation, respiratory function stable and patient connected to nasal cannula oxygen Cardiovascular status: blood pressure returned to baseline and stable Postop Assessment: no apparent nausea or vomiting Anesthetic complications: no    Last Vitals:  Vitals:   04/17/19 1022 04/17/19 1045  BP: 106/78 110/78  Pulse: 94 95  Resp: (!) 24 16  Temp:  36.6 C  SpO2: 98% 100%    Last Pain:  Vitals:   04/17/19 1045  TempSrc:   PainSc: 0-No pain                 Ugonna Keirsey DAVID

## 2019-04-17 NOTE — Anesthesia Preprocedure Evaluation (Signed)
Anesthesia Evaluation  Patient identified by MRN, date of birth, ID band Patient awake    Reviewed: Allergy & Precautions, NPO status , Patient's Chart, lab work & pertinent test results  Airway Mallampati: I  TM Distance: >3 FB Neck ROM: Full    Dental   Pulmonary    Pulmonary exam normal        Cardiovascular Normal cardiovascular exam     Neuro/Psych    GI/Hepatic   Endo/Other    Renal/GU      Musculoskeletal   Abdominal   Peds  Hematology   Anesthesia Other Findings   Reproductive/Obstetrics                             Anesthesia Physical Anesthesia Plan  ASA: II  Anesthesia Plan: General   Post-op Pain Management:    Induction: Intravenous  PONV Risk Score and Plan: 3 and Midazolam, Dexamethasone and Ondansetron  Airway Management Planned: Oral ETT  Additional Equipment:   Intra-op Plan:   Post-operative Plan: Extubation in OR  Informed Consent: I have reviewed the patients History and Physical, chart, labs and discussed the procedure including the risks, benefits and alternatives for the proposed anesthesia with the patient or authorized representative who has indicated his/her understanding and acceptance.       Plan Discussed with: CRNA and Surgeon  Anesthesia Plan Comments:         Anesthesia Quick Evaluation  

## 2019-04-18 ENCOUNTER — Encounter: Payer: Self-pay | Admitting: *Deleted

## 2019-05-01 ENCOUNTER — Telehealth (INDEPENDENT_AMBULATORY_CARE_PROVIDER_SITE_OTHER): Payer: Medicaid Other | Admitting: Obstetrics and Gynecology

## 2019-05-01 ENCOUNTER — Encounter: Payer: Self-pay | Admitting: Obstetrics and Gynecology

## 2019-05-01 DIAGNOSIS — Z9889 Other specified postprocedural states: Secondary | ICD-10-CM

## 2019-05-01 DIAGNOSIS — Z48816 Encounter for surgical aftercare following surgery on the genitourinary system: Secondary | ICD-10-CM

## 2019-05-01 NOTE — Progress Notes (Signed)
    TELEHEALTH GYNECOLOGY VIRTUAL VIDEO VISIT ENCOUNTER NOTE  Provider location: [facility] for [facility] at [place]   I connected with Maria Reed on 05/01/19 at  1:15 PM EST by MyChart Video Encounter at home and verified that I am speaking with the correct person using two identifiers.   I discussed the limitations, risks, security and privacy concerns of performing an evaluation and management service virtually and the availability of in person appointments. I also discussed with the patient that there may be a patient responsible charge related to this service. The patient expressed understanding and agreed to proceed.   History:  Maria Reed is a 39 y.o. [phone] female being evaluated today for post op check/ Patient is s/p LSC bilateral tubal ligation on 04/17/19. She denies any abnormal vaginal discharge, bleeding, pelvic pain or other concerns.       Past Medical History:  Diagnosis Date  . Cervical cancer ([place]) 2017   Past Surgical History:  Procedure Laterality Date  . CERVIX SURGERY    . LAPAROSCOPIC TUBAL LIGATION Bilateral 04/17/2019   Procedure: LAPAROSCOPIC TUBAL LIGATION;  Surgeon: Mora Bellman, MD;  Location: [place];  Service: Gynecology;  Laterality: Bilateral;   The following portions of the patient's history were reviewed and updated as appropriate: allergies, current medications, past family history, past medical history, past social history, past surgical history and problem list.   Health Maintenance:  Normal pap and negative HRHPV on 09/2018.    Review of Systems:  Pertinent items noted in HPI and remainder of comprehensive ROS otherwise negative.  Physical Exam:   General:  Alert, oriented and cooperative. Patient appears to be in no acute distress.  Mental Status: Normal mood and affect. Normal behavior. Normal judgment and thought content.   Respiratory: Normal respiratory effort, no problems with respiration noted  Rest of  physical exam deferred due to type of encounter  Labs and Imaging No results found for this or any previous visit (from the past 336 hour(s)). No results found.     Assessment and Plan:     1. Post-operative state Patient is medically cleared to resume all activities of daily living RTC prn or in July for annual exam       I discussed the assessment and treatment plan with the patient. The patient was provided an opportunity to ask questions and all were answered. The patient agreed with the plan and demonstrated an understanding of the instructions.   The patient was advised to call back or seek an in-person evaluation/go to the ED if the symptoms worsen or if the condition fails to improve as anticipated.  I provided 11 minutes of face-to-face time during this encounter.   Mora Bellman, MD [facility] [place]

## 2019-06-24 ENCOUNTER — Telehealth: Payer: Self-pay

## 2019-06-24 NOTE — Telephone Encounter (Signed)
Pt called with concerns on getting COVID-19 vaccine while breastfeeding. I let pt know I would bring her question to a provider to help direct.  Pt voiced understanding.

## 2020-02-12 ENCOUNTER — Encounter: Payer: Self-pay | Admitting: General Practice

## 2020-09-03 IMAGING — US OBSTETRIC <14 WK US AND TRANSVAGINAL OB US
1 series · 15 of 28 positions shown · non-contrast
Comparison: None.

CLINICAL DATA: Abdominal/pelvic cramping

EXAM:
OBSTETRIC <14 WK US AND TRANSVAGINAL OB US
TECHNIQUE: Both transabdominal and transvaginal ultrasound examinations were
performed for complete evaluation of the gestation as well as the
maternal uterus, adnexal regions, and pelvic cul-de-sac.
Transvaginal technique was performed to assess early pregnancy.

[Series 1: obstetric <14 wk us and transvaginal ob us · 15 of 41 slices shown]
[im 1/41]
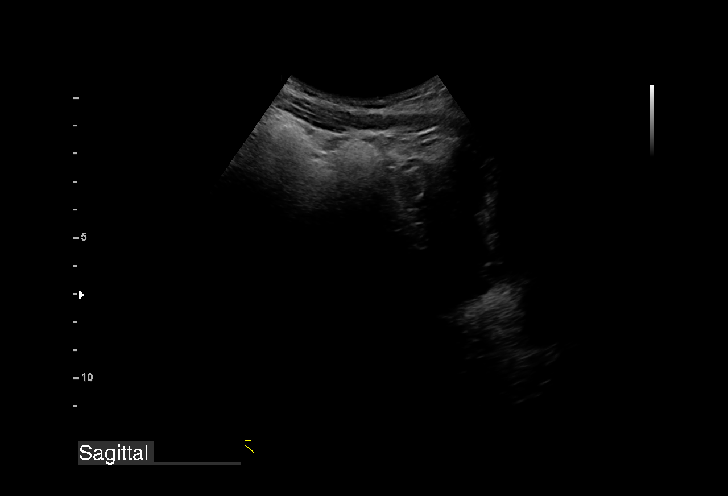
[im 3/41]
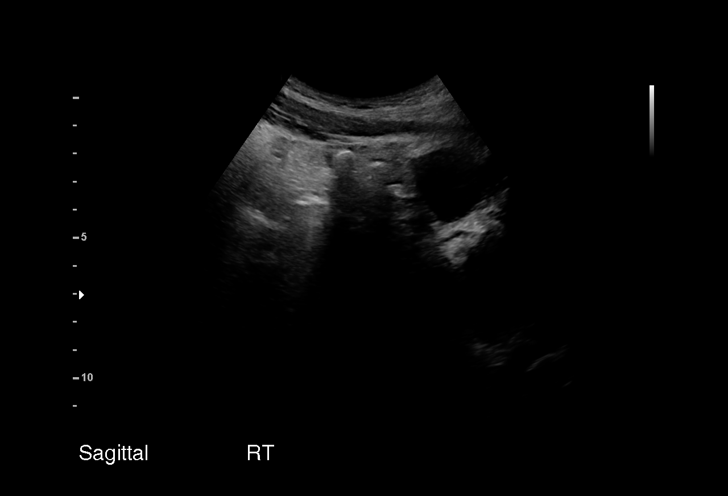
[im 6/41]
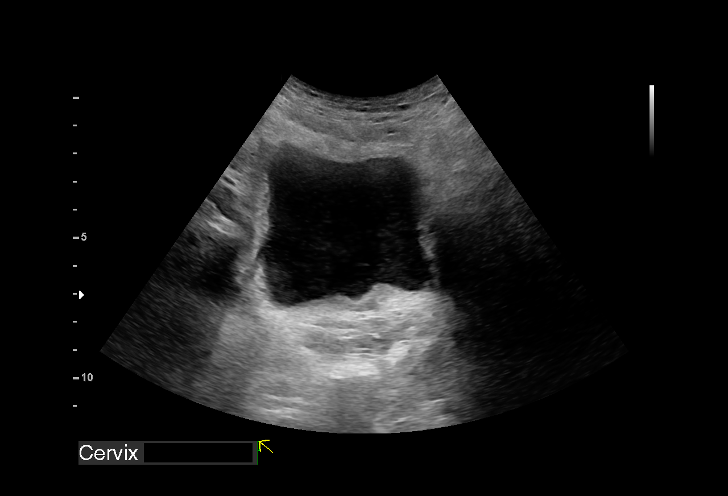
[im 9/41]
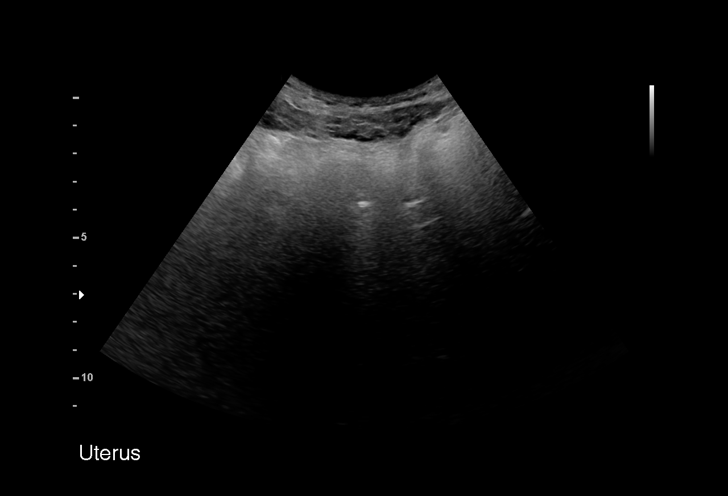
[im 12/41]
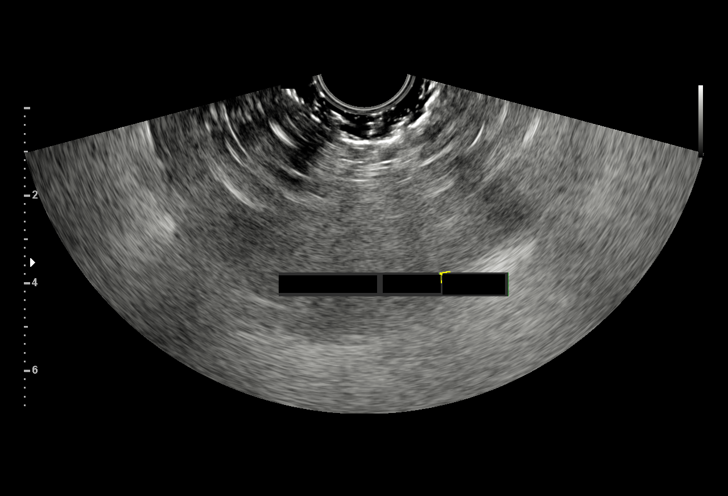
[im 15/41]
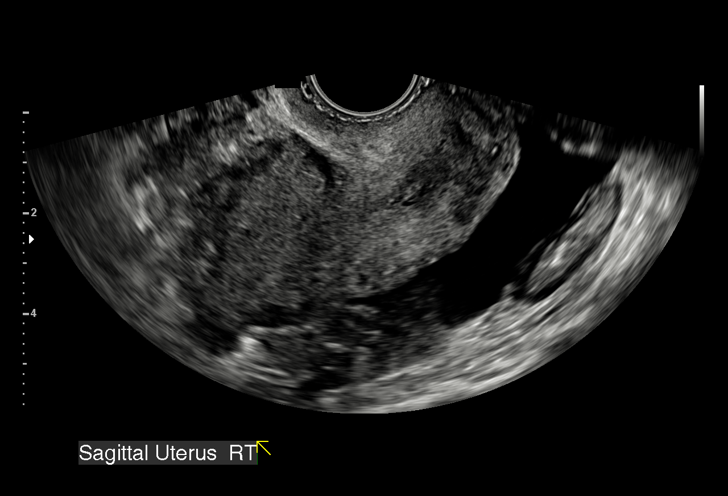
[im 18/41]
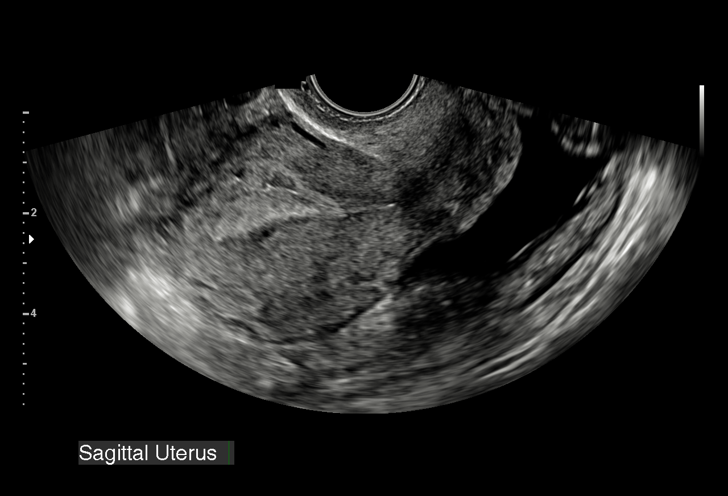
[im 21/41]
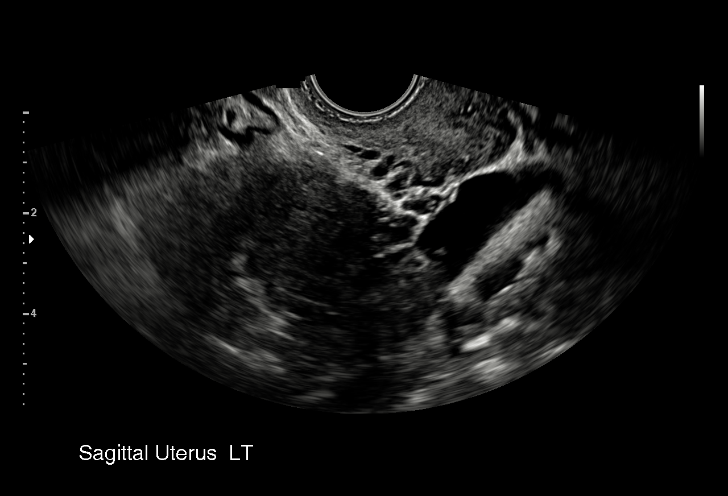
[im 23/41]
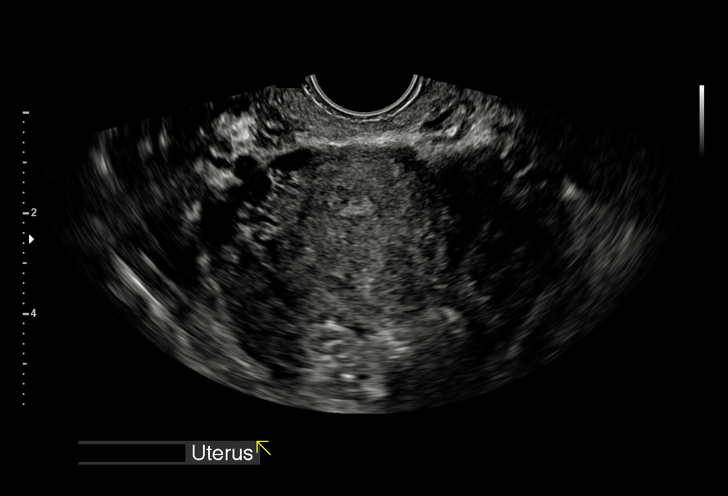
[im 26/41]
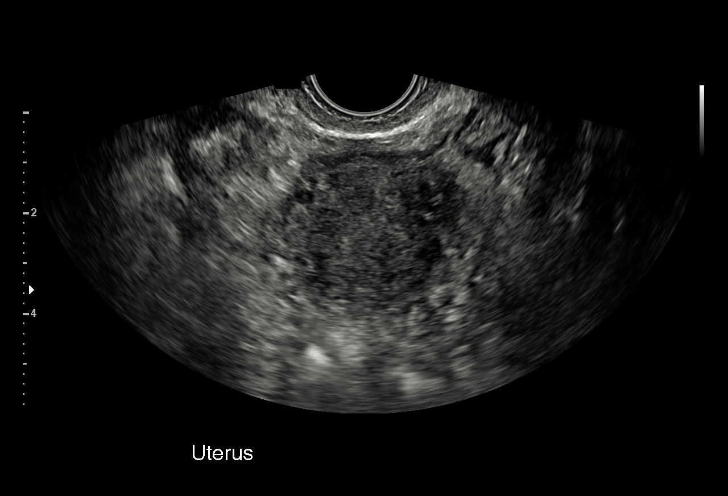
[im 29/41]
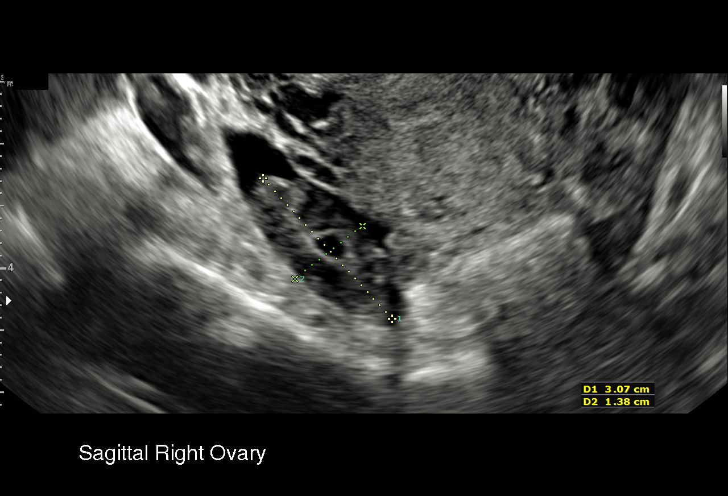
[im 32/41]
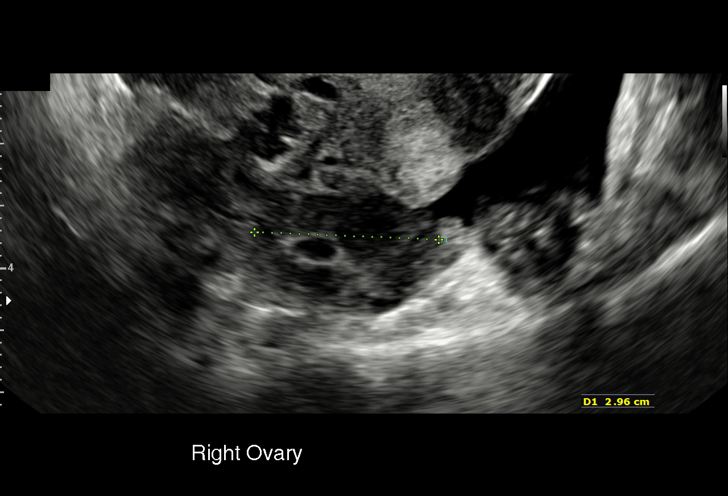
[im 35/41]
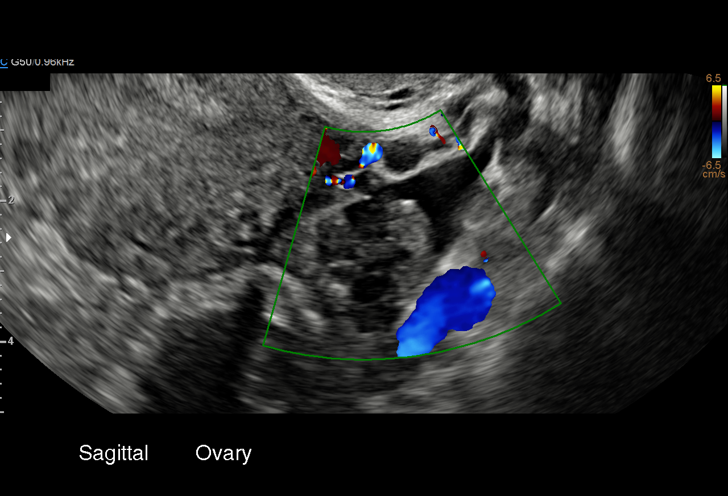
[im 38/41]
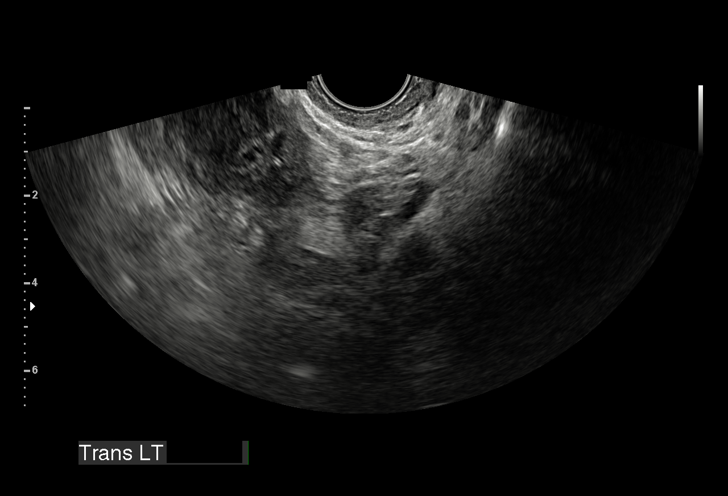
[im 41/41]
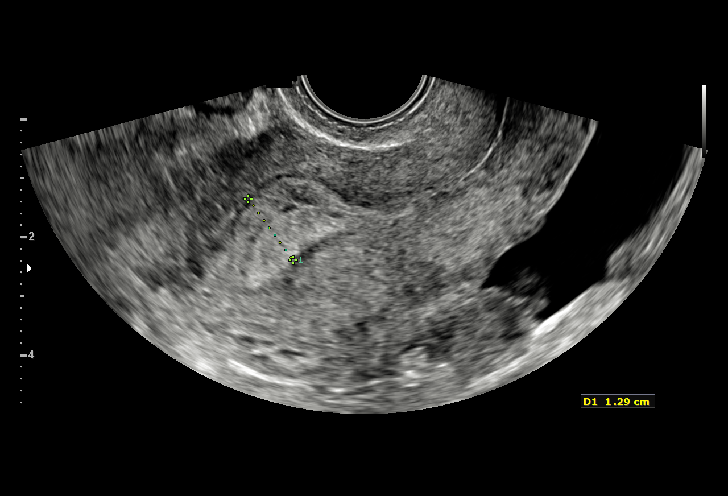

[15 of 28 positions shown; findings below may reference images not displayed]

FINDINGS: Intrauterine gestational sac: Not visualized

Yolk sac:  Not visualized

Embryo:  Not visualized

Cardiac Activity: Not visualized

Subchorionic hemorrhage:  None visualized.

Maternal uterus/adnexae: Endometrium measures 13 mm in thickness.
Right ovary measures 3.1 x 1.4 x 3.0 cm. Left ovary measures 2.0 x
1.2 x 1.2 cm. There is no extrauterine pelvic or adnexal mass. There
is a small amount of free pelvic fluid.
IMPRESSION: No intrauterine mass or intrauterine gestation. Assuming positive
beta HCG value, differential considerations would include
intrauterine gestation too early to be seen by either transabdominal
or transvaginal technique; recent spontaneous abortion; ectopic
gestation. This circumstance warrants close clinical and laboratory
evaluation. Timing of repeat ultrasound in large part will depend on
beta HCG values going forward.

No extrauterine pelvic mass. Small amount of free pelvic fluid
noted. This small amount of fluid could be due to recent ovarian
cyst rupture.

## 2020-12-14 IMAGING — US US MFM OB DETAIL+14 WK
1 series · 13 of 28 positions shown · non-contrast
Comparison: none

[Series 1: us mfm ob detail+14 wk · 122 acquisitions, 13 frames shown]
[im 5/122]
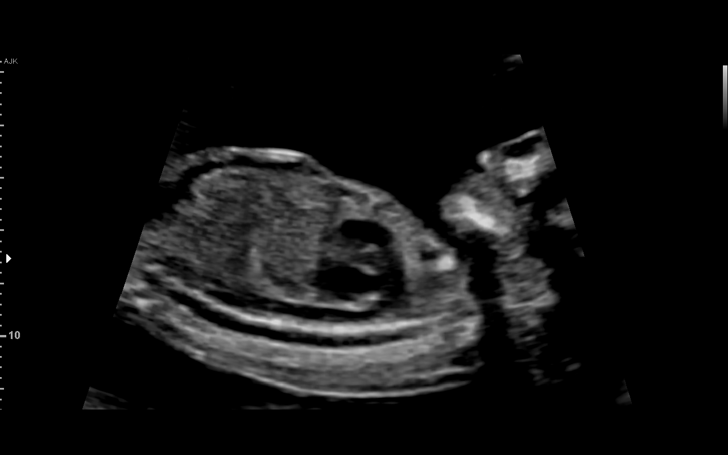
[im 14/122]
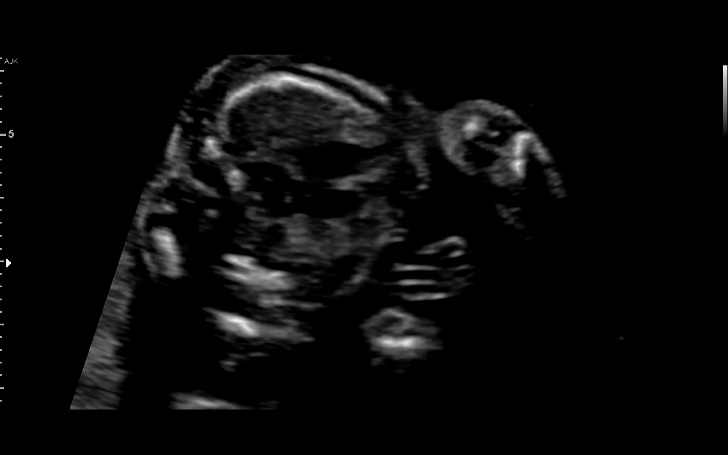
[im 23/122]
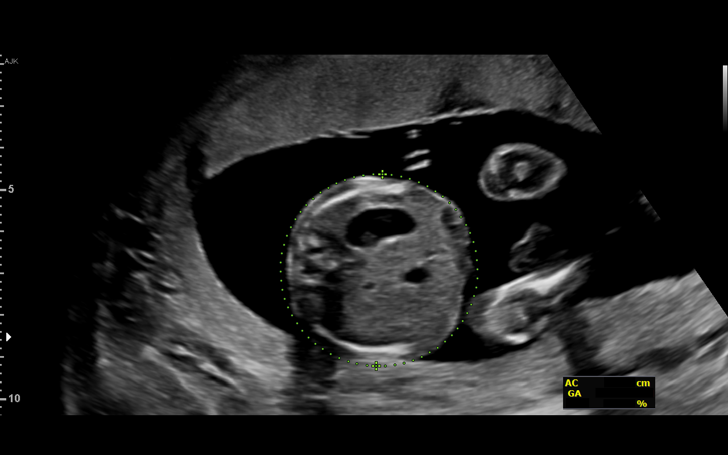
[im 32/122]
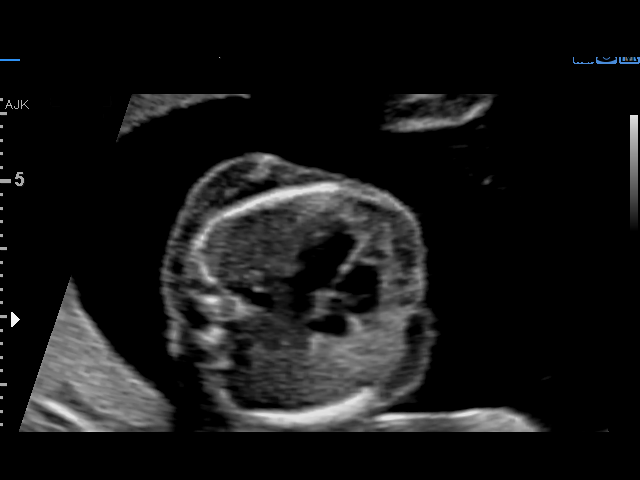
[im 41/122]
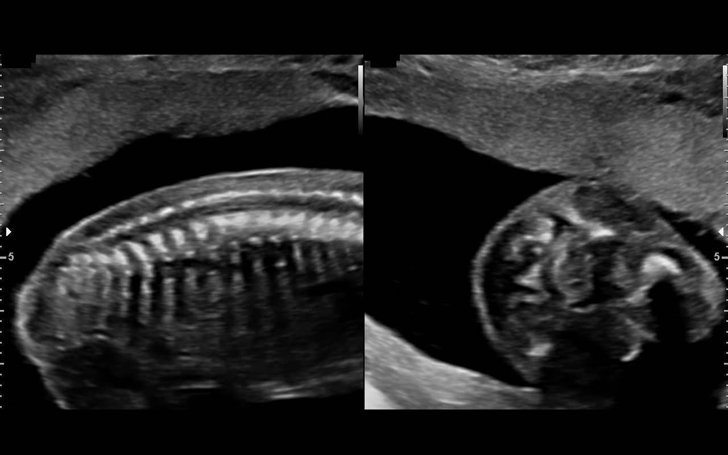
[im 50/122]
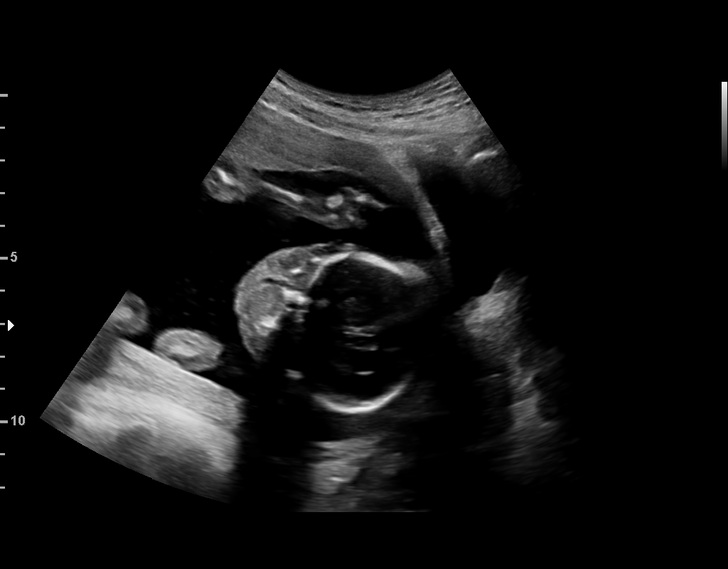
[im 63/122]
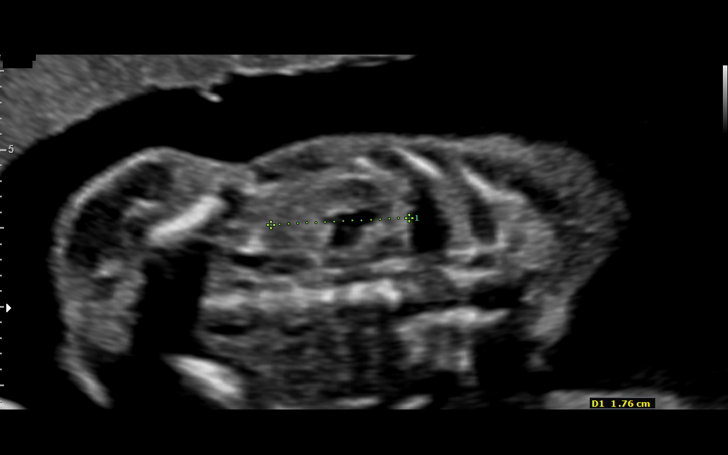
[im 72/122]
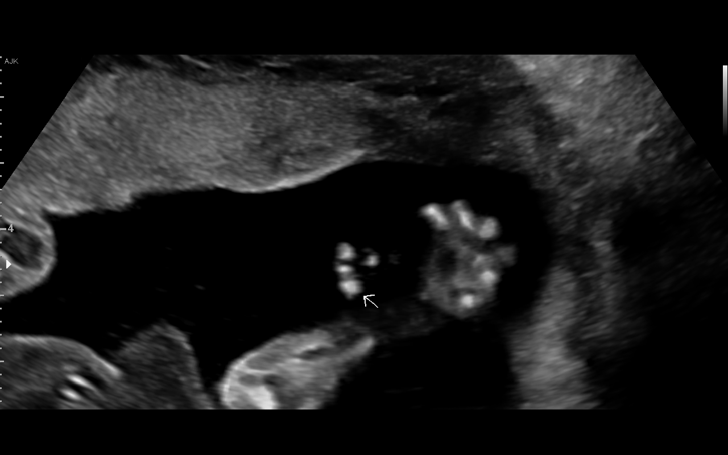
[im 81/122]
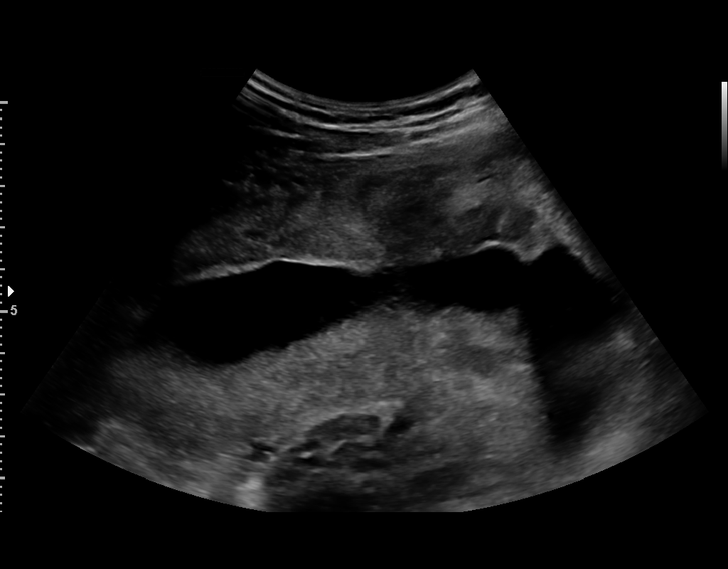
[im 90/122]
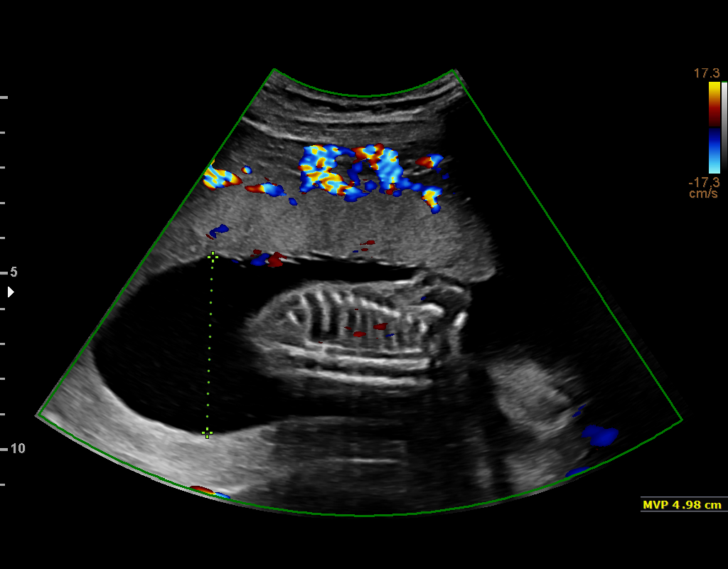
[im 99/122]
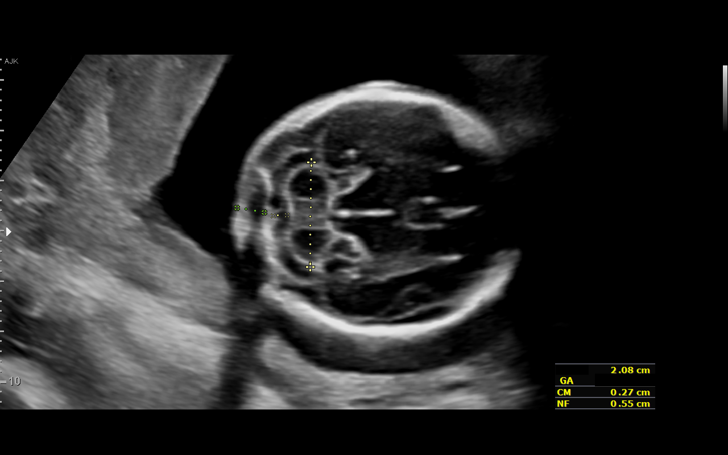
[im 108/122]
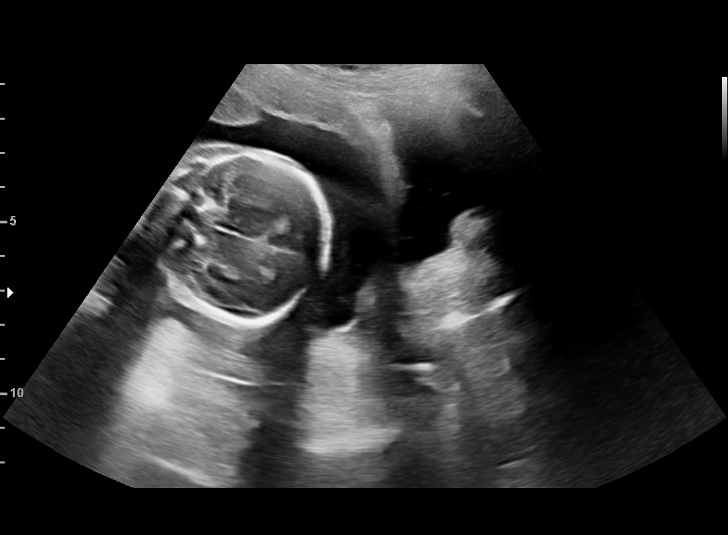
[im 117/122]
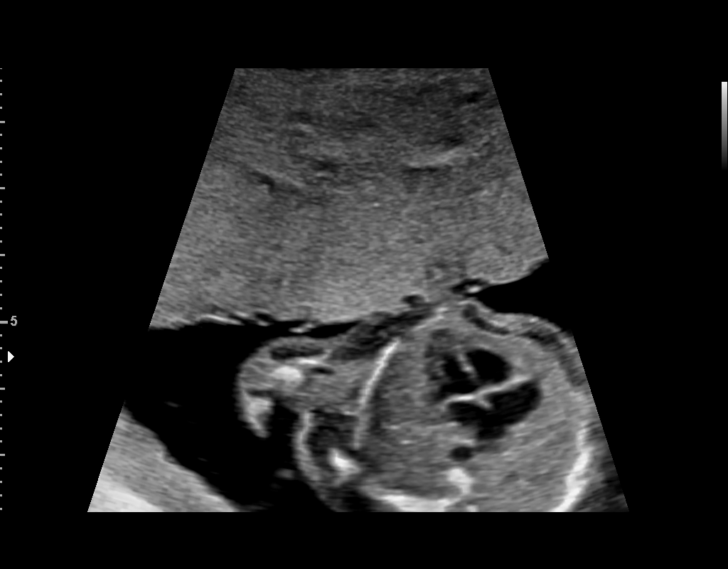

[13 of 28 positions shown; findings below may reference images not displayed]

----------------------------------------------------------------------

 ----------------------------------------------------------------------
Indications

  19 weeks gestation of pregnancy
  Encounter for antenatal screening for
  malformations (Low risk NIPs)
  Rh negative state in antepartum
  Advanced maternal age multigravida 38,
  second trimester
 ----------------------------------------------------------------------
Vital Signs

 BMI:
Fetal Evaluation

 Num Of Fetuses:         1
 Fetal Heart Rate(bpm):  143
 Cardiac Activity:       Observed
 Presentation:           Cephalic
 Placenta:               Anterior
 P. Cord Insertion:      Visualized, central

 Amniotic Fluid
 AFI FV:      Within normal limits

                             Largest Pocket(cm)

Biometry

 BPD:      47.1  mm     G. Age:  20w 2d         86  %    CI:        75.65   %    70 - 86
                                                         FL/HC:      17.8   %    16.1 -
 HC:      171.7  mm     G. Age:  19w 5d         65  %    HC/AC:      1.18        1.09 -
 AC:       145   mm     G. Age:  19w 6d         63  %    FL/BPD:     65.0   %
 FL:       30.6  mm     G. Age:  19w 3d         50  %    FL/AC:      21.1   %    20 - 24
 HUM:      29.2  mm     G. Age:  19w 4d         57  %
 CER:      20.6  mm     G. Age:  19w 4d         56  %
 NFT:       5.5  mm

 LV:        6.1  mm
 CM:        2.7  mm

 Est. FW:     307  gm    0 lb 11 oz      69  %
OB History

 Gravidity:    3          SAB:   2
Gestational Age

 LMP:           19w 2d        Date:  07/01/18                 EDD:   04/07/19
 U/S Today:     19w 6d                                        EDD:   04/03/19
 Best:          19w 2d     Det. By:  LMP  (07/01/18)          EDD:   04/07/19
Anatomy

 Cranium:               Appears normal         Aortic Arch:            Appears normal
 Cavum:                 Appears normal         Ductal Arch:            Appears normal
 Ventricles:            Appears normal         Diaphragm:              Appears normal
 Choroid Plexus:        Appears normal         Stomach:                Appears normal, left
                                                                       sided
 Cerebellum:            Appears normal         Abdomen:                Appears normal
 Posterior Fossa:       Appears normal         Abdominal Wall:         Appears nml (cord
                                                                       insert, abd wall)
 Nuchal Fold:           Appears normal         Cord Vessels:           Appears normal (3
                                                                       vessel cord)
 Face:                  Appears normal         Kidneys:                Appear normal
                        (orbits and profile)
 Lips:                  Appears normal         Bladder:                Appears normal
 Thoracic:              Appears normal         Spine:                  Appears normal
 Heart:                 Appears normal         Upper Extremities:      Appears normal
                        (4CH, axis, and
                        situs)
 RVOT:                  Appears normal         Lower Extremities:      Appears normal
 LVOT:                  Appears normal

 Other:  Fetus appears to be a male. Heels and 5th digit visualized. Nasal
         bone visualized. Open hands visualized. Technically difficult due to
         fetal position.
Cervix Uterus Adnexa

 Cervix
 Length:           3.69  cm.
 Normal appearance by transabdominal scan.

 Uterus
 No abnormality visualized.

 Left Ovary
 Within normal limits.

 Right Ovary
 Within normal limits.
 Adnexa
 No abnormality visualized.
Impression

 We performed fetal anatomy scan. No makers of
 aneuploidies or fetal structural defects are seen. Fetal
 biometry is consistent with her previously-established dates.
 Amniotic fluid is normal and good fetal activity is seen.
 Patient understands the limitations of ultrasound in detecting
 fetal anomalies.

 On cell-free fetal DNA screening, the risks of fetal
 aneuploidies are not increased.

 Patient will get her blood drawn today for MSAFP screening.
Recommendations

 Follow-up scans as clinically indicated.
                 Jim, Kiri
